# Patient Record
Sex: Male | Born: 1965 | Race: White | Hispanic: No | Marital: Married | Smoking: Former smoker
Health system: Southern US, Community
[De-identification: ages and names within clinical notes are randomized; demographics above are authoritative.]

## PROBLEM LIST (undated history)

## (undated) DIAGNOSIS — I10 Essential (primary) hypertension: Secondary | ICD-10-CM

## (undated) DIAGNOSIS — I2510 Atherosclerotic heart disease of native coronary artery without angina pectoris: Secondary | ICD-10-CM

## (undated) DIAGNOSIS — E785 Hyperlipidemia, unspecified: Secondary | ICD-10-CM

## (undated) DIAGNOSIS — I200 Unstable angina: Secondary | ICD-10-CM

## (undated) HISTORY — DX: Atherosclerotic heart disease of native coronary artery without angina pectoris: I25.10

---
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
## 2004-09-23 ENCOUNTER — Ambulatory Visit: Payer: Self-pay | Admitting: Family Medicine

## 2004-10-10 ENCOUNTER — Ambulatory Visit: Payer: Self-pay | Admitting: Family Medicine

## 2004-11-02 ENCOUNTER — Ambulatory Visit: Payer: Self-pay | Admitting: Cardiovascular Disease

## 2004-11-08 ENCOUNTER — Ambulatory Visit: Payer: Self-pay

## 2005-01-05 ENCOUNTER — Ambulatory Visit: Payer: Self-pay | Admitting: Family Medicine

## 2005-02-17 ENCOUNTER — Ambulatory Visit: Payer: Self-pay | Admitting: Family Medicine

## 2005-03-06 ENCOUNTER — Ambulatory Visit: Payer: Self-pay | Admitting: Family Medicine

## 2005-03-21 ENCOUNTER — Ambulatory Visit: Payer: Self-pay | Admitting: Family Medicine

## 2005-04-25 ENCOUNTER — Ambulatory Visit: Payer: Self-pay | Admitting: Family Medicine

## 2005-05-23 ENCOUNTER — Ambulatory Visit: Payer: Self-pay | Admitting: Family Medicine

## 2005-07-19 ENCOUNTER — Ambulatory Visit: Payer: Self-pay | Admitting: Family Medicine

## 2005-10-02 ENCOUNTER — Ambulatory Visit: Payer: Self-pay | Admitting: Family Medicine

## 2005-11-23 ENCOUNTER — Ambulatory Visit: Payer: Self-pay | Admitting: Cardiology

## 2005-11-29 ENCOUNTER — Ambulatory Visit: Payer: Self-pay | Admitting: Cardiology

## 2005-12-14 ENCOUNTER — Ambulatory Visit: Payer: Self-pay | Admitting: Cardiology

## 2018-07-22 DIAGNOSIS — I219 Acute myocardial infarction, unspecified: Secondary | ICD-10-CM

## 2018-07-22 HISTORY — DX: Acute myocardial infarction, unspecified: I21.9

## 2018-07-23 ENCOUNTER — Emergency Department (HOSPITAL_COMMUNITY): Payer: PRIVATE HEALTH INSURANCE

## 2018-07-23 ENCOUNTER — Emergency Department (HOSPITAL_COMMUNITY)
Admission: EM | Admit: 2018-07-23 | Discharge: 2018-07-23 | Disposition: A | Discharge status: Home / Self Care | Payer: PRIVATE HEALTH INSURANCE | Attending: Emergency Medicine | Admitting: Emergency Medicine

## 2018-07-23 ENCOUNTER — Other Ambulatory Visit: Payer: Self-pay

## 2018-07-23 ENCOUNTER — Encounter (HOSPITAL_COMMUNITY): Payer: Self-pay | Admitting: *Deleted

## 2018-07-23 DIAGNOSIS — R079 Chest pain, unspecified: Secondary | ICD-10-CM | POA: Diagnosis present

## 2018-07-23 DIAGNOSIS — E876 Hypokalemia: Secondary | ICD-10-CM | POA: Insufficient documentation

## 2018-07-23 DIAGNOSIS — I10 Essential (primary) hypertension: Secondary | ICD-10-CM | POA: Diagnosis not present

## 2018-07-23 DIAGNOSIS — Z87891 Personal history of nicotine dependence: Secondary | ICD-10-CM | POA: Diagnosis not present

## 2018-07-23 HISTORY — DX: Essential (primary) hypertension: I10

## 2018-07-23 LAB — CBC WITH DIFFERENTIAL/PLATELET
Abs Immature Granulocytes: 0.02 10*3/uL (ref 0.00–0.07)
Basophils Absolute: 0.1 10*3/uL (ref 0.0–0.1)
Basophils Relative: 1 %
Eosinophils Absolute: 0.2 10*3/uL (ref 0.0–0.5)
Eosinophils Relative: 3 %
HCT: 46.7 % (ref 39.0–52.0)
Hemoglobin: 15.9 g/dL (ref 13.0–17.0)
Immature Granulocytes: 0 %
Lymphocytes Relative: 25 %
Lymphs Abs: 1.5 10*3/uL (ref 0.7–4.0)
MCH: 30.4 pg (ref 26.0–34.0)
MCHC: 34 g/dL (ref 30.0–36.0)
MCV: 89.3 fL (ref 80.0–100.0)
Monocytes Absolute: 0.6 10*3/uL (ref 0.1–1.0)
Monocytes Relative: 10 %
Neutro Abs: 3.6 10*3/uL (ref 1.7–7.7)
Neutrophils Relative %: 61 %
Platelets: 256 10*3/uL (ref 150–400)
RBC: 5.23 MIL/uL (ref 4.22–5.81)
RDW: 13.2 % (ref 11.5–15.5)
WBC: 6 10*3/uL (ref 4.0–10.5)
nRBC: 0 % (ref 0.0–0.2)

## 2018-07-23 LAB — TROPONIN I: Troponin I: <0.03 ng/mL (ref <0.03)

## 2018-07-23 LAB — BASIC METABOLIC PANEL
Anion gap: 13 (ref 5–15)
BUN: 11 mg/dL (ref 6–20)
CO2: 23 mmol/L (ref 22–32)
Calcium: 9.2 mg/dL (ref 8.9–10.3)
Chloride: 103 mmol/L (ref 98–111)
Creatinine, Ser: 0.8 mg/dL (ref 0.61–1.24)
GFR calc Af Amer: >60 mL/min (ref >60)
GFR calc non Af Amer: >60 mL/min (ref >60)
Glucose, Bld: 133 mg/dL — ABNORMAL HIGH (ref 70–99)
Potassium: 3.3 mmol/L — ABNORMAL LOW (ref 3.5–5.1)
Sodium: 139 mmol/L (ref 135–145)

## 2018-07-23 MED ORDER — ASPIRIN EC 325 MG PO TBEC
325.0000 mg | DELAYED_RELEASE_TABLET | Freq: Once | ORAL | Status: AC
  Administered 2018-07-23: 09:00:00 325 mg via ORAL
  Filled 2018-07-23: qty 1

## 2018-07-23 NOTE — ED Triage Notes (Signed)
Pt c/o intermittent mid chest pain that radiates to the left elbow and back of neck along with SOB x few weeks. Denies n/v, dizziness.

## 2018-07-23 NOTE — Discharge Instructions (Addendum)
The testing today did not show any serious problems.  Your discomfort may be related to a cardiac problem.  If the discomfort worsens or has other associated symptoms that are concerning, you should return here immediately for further evaluation.  Call your doctor today and let him know that you had to come over here for a checkup.  Proceed with the testing, and follow-up evaluation by cardiology, as planned.  Begin taking one coated aspirin, every day, tomorrow morning.  For now you need to avoid exertion, and stress.  If you are drinking alcohol try to minimize that.  We are giving you a note to avoid working, until you follow-up with the heart doctor.  Your potassium was slightly low today at 3.3.  Try to eat some foods containing potassium, to build that up.  See the attached list.  It could be related to your blood pressure medicine.  Ask your doctor to look into that.

## 2018-07-23 NOTE — ED Provider Notes (Signed)
[facility] EMERGENCY DEPARTMENT Provider Note   CSN: [id number] Arrival date & time: 07/23/18  1700    History   Chief Complaint Chief Complaint  Patient presents with  . Chest Pain    HPI Jordan Gross is a 53 y.o. male.     HPI   He presents for evaluation of chest discomfort.  The discomfort is intermittent and caused only by exertion such as climbing a ladder to work on a roof.  He works as a Designer, fashion/clothing.  Onset of the discomfort, 2 weeks ago, happening more frequently in the last several days.  He is vague about the description, to nursing described as "burning", to me described it as "I get short of breath."  The discomfort is always intermittent and improves with rest within 2 minutes.  He has had to modify his activities to avoid the discomfort.  No prior similar problems.  He takes an antihypertensive.  He saw his PCP yesterday had some blood work and an EKG done but does not know the results, yet.  Apparently, the PCP ordered an echocardiogram and set up a follow-up appointment with a cardiologist, he is waiting to hear the exact timing of those appointments.  He had an episode of the discomfort while walking into a building today, getting ready to do a job.  Currently in the ED he does not have the discomfort.  He denies nausea, vomiting, fever, chills, abdominal or back pain.  He is not having dizziness or numbness.  There are no other known modifying factors.    Past Medical History:  Diagnosis Date  . Hypertension     There are no active problems to display for this patient.   History reviewed. No pertinent surgical history.      Home Medications    Prior to Admission medications   Not on File    Family History Family History  Problem Relation Age of Onset  . Heart attack Mother 59    Social History Social History   Tobacco Use  . Smoking status: Former Games developer  . Smokeless tobacco: Never Used  Substance Use Topics  . Alcohol use: Yes   Alcohol/week: 70.0 standard drinks    Types: 70 Cans of beer per week    Comment: 10 beers daily in the evening  . Drug use: Never     Allergies   Other   Review of Systems Review of Systems  All other systems reviewed and are negative.    Physical Exam Updated Vital Signs BP 135/75   Pulse 74   Temp 98.1 F (36.7 C) (Oral) Comment: Simultaneous filing. User may not have seen previous data. Comment (Src): Simultaneous filing. User may not have seen previous data.  Resp 12   Ht 5\' 11"  (1.803 m)   Wt 117.9 kg   SpO2 96%   BMI 36.26 kg/m   Physical Exam Vitals signs and nursing note reviewed.  Constitutional:      General: He is not in acute distress.    Appearance: He is well-developed. He is obese. He is not ill-appearing, toxic-appearing or diaphoretic.  HENT:     Head: Normocephalic and atraumatic.     Right Ear: External ear normal.     Left Ear: External ear normal.  Eyes:     Conjunctiva/sclera: Conjunctivae normal.     Pupils: Pupils are equal, round, and reactive to light.  Neck:     Musculoskeletal: Normal range of motion and neck supple.  Trachea: Phonation normal.  Cardiovascular:     Rate and Rhythm: Normal rate and regular rhythm.     Heart sounds: Normal heart sounds.  Pulmonary:     Effort: Pulmonary effort is normal. No respiratory distress.     Breath sounds: Normal breath sounds. No stridor. No rhonchi.  Abdominal:     General: There is no distension.     Palpations: Abdomen is soft.     Tenderness: There is no abdominal tenderness.  Musculoskeletal: Normal range of motion.        General: No swelling or tenderness.     Right lower leg: No edema.     Left lower leg: No edema.  Skin:    General: Skin is warm and dry.  Neurological:     Mental Status: He is alert and oriented to person, place, and time.     Cranial Nerves: No cranial nerve deficit.     Sensory: No sensory deficit.     Motor: No abnormal muscle tone.     Coordination:  Coordination normal.  Psychiatric:        Mood and Affect: Mood normal.        Behavior: Behavior normal.        Thought Content: Thought content normal.        Judgment: Judgment normal.      ED Treatments / Results  Labs (all labs ordered are listed, but only abnormal results are displayed) Labs Reviewed  BASIC METABOLIC PANEL - Abnormal; Notable for the following components:      Result Value   Potassium 3.3 (*)    Glucose, Bld 133 (*)    All other components within normal limits  TROPONIN I  CBC WITH DIFFERENTIAL/PLATELET    EKG EKG Interpretation  Date/Time:  Tuesday July 23 2018 07:35:33 EDT Ventricular Rate:  70 PR Interval:    QRS Duration: 95 QT Interval:  430 QTC Calculation: 464 R Axis:   83 Text Interpretation:  Sinus rhythm Anteroseptal infarct, age indeterminate No old tracing to compare Confirmed by Mancel Bale [phone]) on 07/23/2018 7:52:53 AM   Radiology Dg Chest Port 1 View  Result Date: 07/23/2018 CLINICAL DATA:  Intermittent chest pain. EXAM: PORTABLE CHEST 1 VIEW COMPARISON:  No prior. FINDINGS: Mediastinum and hilar structures normal. Cardiomegaly with normal pulmonary vascularity. Low lung volumes. Mild peribronchial cuffing noted. Bronchitis cannot be excluded. No focal infiltrate. No pleural effusion or pneumothorax. No acute bony abnormality. IMPRESSION: Low lung volumes. Mild peribronchial cuffing. Bronchitis cannot be excluded. Electronically Signed   By: Maisie Fus  Register   On: 07/23/2018 08:10    Procedures Procedures (including critical care time)  Medications Ordered in ED Medications  aspirin EC tablet 325 mg (325 mg Oral Given 07/23/18 0835)     Initial Impression / Assessment and Plan / ED Course  I have reviewed the triage vital signs and the nursing notes.  Pertinent labs & imaging results that were available during my care of the patient were reviewed by me and considered in my medical decision making (see chart for details).   Clinical Course as of Jul 22 848  Tue Jul 23, 2018  0817 Normal except potassium low, glucose high  Basic metabolic panel(!) [EW]  [id number] Normal  Troponin I - Once [EW]  0817 Normal  CBC with Differential [EW]  0818 No infiltrate or CHF, image reviewed by me  [facility] [EW]    Clinical Course User Index [EW] Mancel Bale, MD  Patient Vitals for the past 24 hrs:  BP Temp Temp src Pulse Resp SpO2 Height Weight  07/23/18 0835 135/75 - - 74 12 96 % - -  07/23/18 0800 (!) 148/76 - - 72 19 96 % - -  07/23/18 0736 (!) 153/88 98.1 F (36.7 C) Oral 76 18 97 % - -  07/23/18 0730 - - - - - - 5\' 11"  (1.803 m) 117.9 kg    8:43 AM Reevaluation with update and discussion. After initial assessment and treatment, an updated evaluation reveals he continues to be comfortable and symptom-free.  Findings discussed with the patient and all questions were answered. Mancel BaleElliott Hanif Radin   Medical Decision Making: Nonspecific chest discomfort, etiology includes cardiac causes.  Patient is non-smoker.  Chest x-ray somewhat abnormal but not diagnostic for an acute pulmonary process.  I have relatively low suspicion for ACS, in this patient who has a low cardiac risk profile.  His symptoms are subacute, and nonexistent in the ED.  He is stable for the outpatient evaluation which is already scheduled, by his PCP.  He will be advised to not work until he follows up with the cardiologist.  Also given advice on return for worsening symptoms.  CRITICAL CARE-no Performed by: Mancel BaleElliott Jaysa Kise   Nursing Notes Reviewed/ Care Coordinated Applicable Imaging Reviewed Interpretation of Laboratory Data incorporated into ED treatment  The patient appears reasonably screened and/or stabilized for discharge and I doubt any other medical condition or other [facility] requiring further screening, evaluation, or treatment in the ED at this time prior to discharge.  Plan: Home Medications-continue current medication and  take a coated aspirin daily.; Home Treatments-avoid exertion; return here if the recommended treatment, does not improve the symptoms; Recommended follow up-follow-up with PCP and cardiology as scheduled.  Return here for exacerbation of symptoms.    Final Clinical Impressions(s) / ED Diagnoses   Final diagnoses:  Nonspecific chest pain  Hypokalemia    ED Discharge Orders    None       Mancel BaleWentz, Corvin Sorbo, MD 07/23/18 [phone]

## 2018-07-30 ENCOUNTER — Telehealth: Payer: Self-pay | Admitting: Cardiology

## 2018-07-30 NOTE — Telephone Encounter (Signed)

## 2018-07-31 ENCOUNTER — Encounter: Payer: Self-pay | Admitting: Cardiology

## 2018-07-31 ENCOUNTER — Encounter: Payer: Self-pay | Admitting: *Deleted

## 2018-07-31 ENCOUNTER — Telehealth: Payer: Self-pay | Admitting: Cardiology

## 2018-07-31 ENCOUNTER — Ambulatory Visit (INDEPENDENT_AMBULATORY_CARE_PROVIDER_SITE_OTHER): Payer: No Typology Code available for payment source | Admitting: Cardiology

## 2018-07-31 VITALS — BP 145/98 | HR 70 | Temp 98.7°F | Ht 72.0 in | Wt 260.2 lb

## 2018-07-31 DIAGNOSIS — R0789 Other chest pain: Secondary | ICD-10-CM | POA: Diagnosis not present

## 2018-07-31 DIAGNOSIS — R079 Chest pain, unspecified: Secondary | ICD-10-CM

## 2018-07-31 NOTE — Telephone Encounter (Signed)
Pre-cert Verification for the following procedure    EXERCISE NUCLEAR STRESS TEST scheduled for 08-02-2018 at [facility]

## 2018-07-31 NOTE — Progress Notes (Signed)
     Clinical Summary Jordan Gross is a 53 y.o.male seen as new patient for the following medical problems.    1. Chest pain - ER visit 07/23/2018 with chest pain - EKG SR, nonspecific ST/T changes - CXR mild peribronchial cuffing - trop neg x 1   - chest pain started 2-3 weeks ago - burning/pressure, can feel in neck, midchest, headache, into left. Choking like feeling. Tends to occur with activity. No SOB. Sits down and rest, 2 minutes resolves. Not positional  CAD risk factors: HTN, borderlin cholesterol, former smoke x 20 years, mother MI age 57, reports sister with recent stents age 68.   Past Medical History:  Diagnosis Date  . Hypertension      Allergies  Allergen Reactions  . Other     Some type of cholesterol medication, unknown what type that caused muscle cramps in his legs     No current outpatient medications on file.   No current facility-administered medications for this visit.      No past surgical history on file.   Allergies  Allergen Reactions  . Other     Some type of cholesterol medication, unknown what type that caused muscle cramps in his legs      Family History  Problem Relation Age of Onset  . Heart attack Mother 35     Social History Jordan Gross reports that he has quit smoking. He has never used smokeless tobacco. Jordan Gross reports current alcohol use of about 70.0 standard drinks of alcohol per week.   Review of Systems CONSTITUTIONAL: No weight loss, fever, chills, weakness or fatigue.  HEENT: Eyes: No visual loss, blurred vision, double vision or yellow sclerae.No hearing loss, sneezing, congestion, runny nose or sore throat.  SKIN: No rash or itching.  CARDIOVASCULAR: per hpi RESPIRATORY: No shortness of breath, cough or sputum.  GASTROINTESTINAL: No anorexia, nausea, vomiting or diarrhea. No abdominal pain or blood.  GENITOURINARY: No burning on urination, no polyuria NEUROLOGICAL: No headache, dizziness, syncope,  paralysis, ataxia, numbness or tingling in the extremities. No change in bowel or bladder control.  MUSCULOSKELETAL: No muscle, back pain, joint pain or stiffness.  LYMPHATICS: No enlarged nodes. No history of splenectomy.  PSYCHIATRIC: No history of depression or anxiety.  ENDOCRINOLOGIC: No reports of sweating, cold or heat intolerance. No polyuria or polydipsia.  Marland Kitchen   Physical Examination Vitals:   07/31/18 1439 07/31/18 1444  BP: (!) 144/95 (!) 145/98  Pulse: 71 70  Temp: 98.7 F (37.1 C)   SpO2: 97% 97%    Gen: resting comfortably, no acute distress HEENT: no scleral icterus, pupils equal round and reactive, no palptable cervical adenopathy,  CV: RRR, no mr/g no jvd Resp: Clear to auscultation bilaterally GI: abdomen is soft, non-tender, non-distended, normal bowel sounds, no hepatosplenomegaly MSK: extremities are warm, no edema.  Skin: warm, no rash Neuro:  no focal deficits Psych: appropriate affect      Assessment and Plan  1. Chest pain - mixed symptoms, though they do seem to be exertional - multiple CAD risk factors - we will plan for an exercise nuclear stress test  Work excuse given until Tues June 16.       Arnoldo Lenis, M.D.

## 2018-07-31 NOTE — Patient Instructions (Signed)
Your physician recommends that you schedule a follow-up appointment in: 1 MONTH WITH DR BRANCH  Your physician recommends that you continue on your current medications as directed. Please refer to the Current Medication list given to you today.  Your physician has requested that you have en exercise stress myoview. For further information please visit [email or web address]. Please follow instruction sheet, as given.  Thank you for choosing [facility]!!    

## 2018-08-02 ENCOUNTER — Inpatient Hospital Stay (HOSPITAL_COMMUNITY): Payer: PRIVATE HEALTH INSURANCE

## 2018-08-02 ENCOUNTER — Encounter (HOSPITAL_COMMUNITY): Payer: Self-pay

## 2018-08-02 ENCOUNTER — Other Ambulatory Visit: Payer: Self-pay

## 2018-08-02 ENCOUNTER — Emergency Department (HOSPITAL_COMMUNITY): Payer: PRIVATE HEALTH INSURANCE

## 2018-08-02 ENCOUNTER — Encounter (HOSPITAL_COMMUNITY)
Admission: RE | Admit: 2018-08-02 | Discharge: 2018-08-02 | Disposition: A | Discharge status: Home / Self Care | Payer: PRIVATE HEALTH INSURANCE | Source: Ambulatory Visit | Attending: Cardiology | Admitting: Cardiology

## 2018-08-02 ENCOUNTER — Inpatient Hospital Stay (HOSPITAL_COMMUNITY)
Admission: EM | Admit: 2018-08-02 | Discharge: 2018-08-03 | DRG: 247 | Disposition: A | Discharge status: Home / Self Care | Payer: PRIVATE HEALTH INSURANCE | Attending: Interventional Cardiology | Admitting: Interventional Cardiology

## 2018-08-02 ENCOUNTER — Inpatient Hospital Stay (HOSPITAL_COMMUNITY)
Admission: EM | Disposition: A | Discharge status: Home / Self Care | Payer: Self-pay | Source: Home / Self Care | Attending: Interventional Cardiology

## 2018-08-02 DIAGNOSIS — I213 ST elevation (STEMI) myocardial infarction of unspecified site: Principal | ICD-10-CM | POA: Diagnosis present

## 2018-08-02 DIAGNOSIS — R079 Chest pain, unspecified: Secondary | ICD-10-CM | POA: Diagnosis present

## 2018-08-02 DIAGNOSIS — Z95820 Peripheral vascular angioplasty status with implants and grafts: Secondary | ICD-10-CM

## 2018-08-02 DIAGNOSIS — I249 Acute ischemic heart disease, unspecified: Secondary | ICD-10-CM | POA: Diagnosis not present

## 2018-08-02 DIAGNOSIS — I25110 Atherosclerotic heart disease of native coronary artery with unstable angina pectoris: Secondary | ICD-10-CM

## 2018-08-02 DIAGNOSIS — I200 Unstable angina: Secondary | ICD-10-CM | POA: Diagnosis not present

## 2018-08-02 DIAGNOSIS — Z955 Presence of coronary angioplasty implant and graft: Secondary | ICD-10-CM

## 2018-08-02 DIAGNOSIS — Z1159 Encounter for screening for other viral diseases: Secondary | ICD-10-CM

## 2018-08-02 DIAGNOSIS — Z79899 Other long term (current) drug therapy: Secondary | ICD-10-CM | POA: Diagnosis not present

## 2018-08-02 DIAGNOSIS — Z8249 Family history of ischemic heart disease and other diseases of the circulatory system: Secondary | ICD-10-CM | POA: Diagnosis not present

## 2018-08-02 DIAGNOSIS — R9439 Abnormal result of other cardiovascular function study: Secondary | ICD-10-CM

## 2018-08-02 DIAGNOSIS — E785 Hyperlipidemia, unspecified: Secondary | ICD-10-CM | POA: Diagnosis present

## 2018-08-02 DIAGNOSIS — R0789 Other chest pain: Secondary | ICD-10-CM | POA: Insufficient documentation

## 2018-08-02 DIAGNOSIS — I2102 ST elevation (STEMI) myocardial infarction involving left anterior descending coronary artery: Secondary | ICD-10-CM | POA: Diagnosis present

## 2018-08-02 DIAGNOSIS — I10 Essential (primary) hypertension: Secondary | ICD-10-CM | POA: Diagnosis present

## 2018-08-02 DIAGNOSIS — Z7982 Long term (current) use of aspirin: Secondary | ICD-10-CM

## 2018-08-02 DIAGNOSIS — Z888 Allergy status to other drugs, medicaments and biological substances status: Secondary | ICD-10-CM | POA: Diagnosis not present

## 2018-08-02 HISTORY — PX: LEFT HEART CATH AND CORONARY ANGIOGRAPHY: CATH118249

## 2018-08-02 HISTORY — DX: Unstable angina: I20.0

## 2018-08-02 HISTORY — PX: CORONARY/GRAFT ACUTE MI REVASCULARIZATION: CATH118305

## 2018-08-02 LAB — PROTIME-INR
INR: 1 (ref 0.8–1.2)
Prothrombin Time: 12.8 seconds (ref 11.4–15.2)

## 2018-08-02 LAB — CBC WITH DIFFERENTIAL/PLATELET
Abs Immature Granulocytes: 0.06 10*3/uL (ref 0.00–0.07)
Basophils Absolute: 0.1 10*3/uL (ref 0.0–0.1)
Basophils Relative: 1 %
Eosinophils Absolute: 0.1 10*3/uL (ref 0.0–0.5)
Eosinophils Relative: 1 %
HCT: 47.4 % (ref 39.0–52.0)
Hemoglobin: 16.3 g/dL (ref 13.0–17.0)
Immature Granulocytes: 1 %
Lymphocytes Relative: 16 %
Lymphs Abs: 1.5 10*3/uL (ref 0.7–4.0)
MCH: 30.5 pg (ref 26.0–34.0)
MCHC: 34.4 g/dL (ref 30.0–36.0)
MCV: 88.6 fL (ref 80.0–100.0)
Monocytes Absolute: 0.8 10*3/uL (ref 0.1–1.0)
Monocytes Relative: 9 %
Neutro Abs: 6.5 10*3/uL (ref 1.7–7.7)
Neutrophils Relative %: 72 %
Platelets: 313 10*3/uL (ref 150–400)
RBC: 5.35 MIL/uL (ref 4.22–5.81)
RDW: 12.8 % (ref 11.5–15.5)
WBC: 8.9 10*3/uL (ref 4.0–10.5)
nRBC: 0 % (ref 0.0–0.2)

## 2018-08-02 LAB — ECHOCARDIOGRAM COMPLETE
Height: 74 in
Weight: 4160 oz

## 2018-08-02 LAB — NM MYOCAR SINGLE W/SPECT
Peak HR: 122 {beats}/min
Rest HR: 94 {beats}/min

## 2018-08-02 LAB — BASIC METABOLIC PANEL
Anion gap: 12 (ref 5–15)
BUN: 14 mg/dL (ref 6–20)
CO2: 23 mmol/L (ref 22–32)
Calcium: 9.3 mg/dL (ref 8.9–10.3)
Chloride: 103 mmol/L (ref 98–111)
Creatinine, Ser: 0.82 mg/dL (ref 0.61–1.24)
GFR calc Af Amer: >60 mL/min (ref >60)
GFR calc non Af Amer: >60 mL/min (ref >60)
Glucose, Bld: 135 mg/dL — ABNORMAL HIGH (ref 70–99)
Potassium: 3.5 mmol/L (ref 3.5–5.1)
Sodium: 138 mmol/L (ref 135–145)

## 2018-08-02 LAB — POCT ACTIVATED CLOTTING TIME
Activated Clotting Time: 290 seconds
Activated Clotting Time: 274 seconds

## 2018-08-02 LAB — LIPID PANEL
Cholesterol: 206 mg/dL — ABNORMAL HIGH (ref 0–200)
HDL: 31 mg/dL — ABNORMAL LOW (ref >40)
LDL Cholesterol: 124 mg/dL — ABNORMAL HIGH (ref 0–99)
Total CHOL/HDL Ratio: 6.6 RATIO
Triglycerides: 256 mg/dL — ABNORMAL HIGH (ref <150)
VLDL: 51 mg/dL — ABNORMAL HIGH (ref 0–40)

## 2018-08-02 LAB — TROPONIN I
Troponin I: 0.07 ng/mL (ref <0.03)
Troponin I: 0.37 ng/mL (ref <0.03)
Troponin I: 1.24 ng/mL (ref <0.03)

## 2018-08-02 LAB — SARS CORONAVIRUS 2 BY RT PCR (HOSPITAL ORDER, PERFORMED IN ~~LOC~~ HOSPITAL LAB): SARS Coronavirus 2: NEGATIVE

## 2018-08-02 LAB — HEMOGLOBIN A1C
Hgb A1c MFr Bld: 5.9 % — ABNORMAL HIGH (ref 4.8–5.6)
Mean Plasma Glucose: 122.63 mg/dL

## 2018-08-02 LAB — APTT: aPTT: 26 seconds (ref 24–36)

## 2018-08-02 LAB — TSH: TSH: 2.664 u[IU]/mL (ref 0.350–4.500)

## 2018-08-02 SURGERY — CORONARY/GRAFT ACUTE MI REVASCULARIZATION
Anesthesia: LOCAL

## 2018-08-02 MED ORDER — NITROGLYCERIN 0.4 MG SL SUBL
SUBLINGUAL_TABLET | SUBLINGUAL | Status: AC
  Administered 2018-08-02: 10:00:00 via SUBLINGUAL
  Filled 2018-08-02: qty 1

## 2018-08-02 MED ORDER — HEPARIN (PORCINE) IN NACL 1000-0.9 UT/500ML-% IV SOLN
INTRAVENOUS | Status: AC
  Filled 2018-08-02: qty 1000

## 2018-08-02 MED ORDER — HYDRALAZINE HCL 20 MG/ML IJ SOLN
10.0000 mg | Freq: Four times a day (QID) | INTRAMUSCULAR | Status: DC | PRN
  Administered 2018-08-03: 10 mg via INTRAVENOUS
  Filled 2018-08-02: qty 1

## 2018-08-02 MED ORDER — NITROGLYCERIN 1 MG/10 ML FOR IR/CATH LAB
INTRA_ARTERIAL | Status: DC | PRN
  Administered 2018-08-02: 200 ug via INTRACORONARY
  Administered 2018-08-02: 400 ug via INTRA_ARTERIAL

## 2018-08-02 MED ORDER — FENTANYL CITRATE (PF) 100 MCG/2ML IJ SOLN
INTRAMUSCULAR | Status: AC
  Filled 2018-08-02: qty 2

## 2018-08-02 MED ORDER — TICAGRELOR 90 MG PO TABS
ORAL_TABLET | ORAL | Status: DC | PRN
  Administered 2018-08-02: 180 mg via ORAL

## 2018-08-02 MED ORDER — SODIUM CHLORIDE 0.9% FLUSH
3.0000 mL | Freq: Two times a day (BID) | INTRAVENOUS | Status: DC
  Administered 2018-08-02: 3 mL via INTRAVENOUS

## 2018-08-02 MED ORDER — TICAGRELOR 90 MG PO TABS
90.0000 mg | ORAL_TABLET | Freq: Two times a day (BID) | ORAL | Status: DC
  Administered 2018-08-02 – 2018-08-03 (×2): 90 mg via ORAL
  Filled 2018-08-02 (×2): qty 1

## 2018-08-02 MED ORDER — HYDRALAZINE HCL 20 MG/ML IJ SOLN: 10.0000 mg | INTRAMUSCULAR | Status: DC | PRN

## 2018-08-02 MED ORDER — TIROFIBAN (AGGRASTAT) BOLUS VIA INFUSION
INTRAVENOUS | Status: DC | PRN
  Administered 2018-08-02: 12:00:00 2947.5 ug via INTRAVENOUS

## 2018-08-02 MED ORDER — ONDANSETRON HCL 4 MG/2ML IJ SOLN: 4.0000 mg | Freq: Four times a day (QID) | INTRAMUSCULAR | Status: DC | PRN

## 2018-08-02 MED ORDER — LOSARTAN POTASSIUM 50 MG PO TABS
100.0000 mg | ORAL_TABLET | Freq: Every day | ORAL | Status: DC
  Administered 2018-08-03: 100 mg via ORAL
  Filled 2018-08-02: qty 2

## 2018-08-02 MED ORDER — NITROGLYCERIN IN D5W 200-5 MCG/ML-% IV SOLN
5.0000 ug/min | INTRAVENOUS | Status: DC
  Filled 2018-08-02: qty 250

## 2018-08-02 MED ORDER — THE SENSUOUS HEART BOOK
Freq: Once | Status: AC
  Administered 2018-08-03: 05:00:00
  Filled 2018-08-02: qty 1

## 2018-08-02 MED ORDER — METOPROLOL TARTRATE 12.5 MG HALF TABLET
12.5000 mg | ORAL_TABLET | Freq: Two times a day (BID) | ORAL | Status: DC
  Administered 2018-08-02: 19:00:00 12.5 mg via ORAL
  Filled 2018-08-02: qty 1

## 2018-08-02 MED ORDER — HEPARIN SODIUM (PORCINE) 1000 UNIT/ML IJ SOLN
INTRAMUSCULAR | Status: DC | PRN
  Administered 2018-08-02: 6000 [IU] via INTRAVENOUS
  Administered 2018-08-02: 5000 [IU] via INTRAVENOUS
  Administered 2018-08-02: 2000 [IU] via INTRAVENOUS

## 2018-08-02 MED ORDER — TIROFIBAN HCL IN NACL 5-0.9 MG/100ML-% IV SOLN
INTRAVENOUS | Status: AC
  Filled 2018-08-02: qty 100

## 2018-08-02 MED ORDER — HEART ATTACK BOUNCING BOOK
Freq: Once | Status: AC
  Administered 2018-08-03: 05:00:00
  Filled 2018-08-02: qty 1

## 2018-08-02 MED ORDER — MIDAZOLAM HCL 2 MG/2ML IJ SOLN
INTRAMUSCULAR | Status: DC | PRN
  Administered 2018-08-02: 2 mg via INTRAVENOUS

## 2018-08-02 MED ORDER — ASPIRIN 325 MG PO TABS
325.0000 mg | ORAL_TABLET | Freq: Once | ORAL | Status: AC
  Administered 2018-08-02: 325 mg via ORAL
  Filled 2018-08-02: qty 1

## 2018-08-02 MED ORDER — VERAPAMIL HCL 2.5 MG/ML IV SOLN
INTRAVENOUS | Status: DC | PRN
  Administered 2018-08-02: 12:00:00 10 mL via INTRA_ARTERIAL

## 2018-08-02 MED ORDER — ZOLPIDEM TARTRATE 5 MG PO TABS: 5.0000 mg | ORAL_TABLET | Freq: Every evening | ORAL | Status: DC | PRN

## 2018-08-02 MED ORDER — FENTANYL CITRATE (PF) 100 MCG/2ML IJ SOLN
INTRAMUSCULAR | Status: DC | PRN
  Administered 2018-08-02: 50 ug via INTRAVENOUS

## 2018-08-02 MED ORDER — NITROGLYCERIN 0.4 MG SL SUBL: 0.4000 mg | SUBLINGUAL_TABLET | SUBLINGUAL | Status: DC | PRN

## 2018-08-02 MED ORDER — HEPARIN (PORCINE) 25000 UT/250ML-% IV SOLN: 1400.0000 [IU]/h | INTRAVENOUS | Status: DC

## 2018-08-02 MED ORDER — LIDOCAINE HCL (PF) 1 % IJ SOLN
INTRAMUSCULAR | Status: DC | PRN
  Administered 2018-08-02: 2 mL

## 2018-08-02 MED ORDER — LIDOCAINE HCL (PF) 1 % IJ SOLN
INTRAMUSCULAR | Status: AC
  Filled 2018-08-02: qty 30

## 2018-08-02 MED ORDER — HEPARIN SODIUM (PORCINE) 5000 UNIT/ML IJ SOLN
4000.0000 [IU] | Freq: Once | INTRAMUSCULAR | Status: AC
  Administered 2018-08-02: 4000 [IU] via INTRAVENOUS
  Filled 2018-08-02: qty 1

## 2018-08-02 MED ORDER — VERAPAMIL HCL 2.5 MG/ML IV SOLN
INTRAVENOUS | Status: AC
  Filled 2018-08-02: qty 2

## 2018-08-02 MED ORDER — HEPARIN (PORCINE) IN NACL 1000-0.9 UT/500ML-% IV SOLN
INTRAVENOUS | Status: DC | PRN
  Administered 2018-08-02 (×2): 500 mL

## 2018-08-02 MED ORDER — NITROGLYCERIN IN D5W 200-5 MCG/ML-% IV SOLN: 5.0000 ug/min | INTRAVENOUS | Status: DC

## 2018-08-02 MED ORDER — SODIUM CHLORIDE 0.9 % IV SOLN
INTRAVENOUS | Status: AC | PRN
  Administered 2018-08-02: 10 mL/h via INTRAVENOUS

## 2018-08-02 MED ORDER — ROSUVASTATIN CALCIUM 20 MG PO TABS
20.0000 mg | ORAL_TABLET | Freq: Every day | ORAL | Status: DC
  Administered 2018-08-02: 20 mg via ORAL
  Filled 2018-08-02: qty 1

## 2018-08-02 MED ORDER — SODIUM CHLORIDE 0.9% FLUSH
INTRAVENOUS | Status: AC
  Administered 2018-08-02: 10 mL via INTRAVENOUS
  Filled 2018-08-02: qty 10

## 2018-08-02 MED ORDER — SODIUM CHLORIDE 0.9 % IV SOLN: 250.0000 mL | INTRAVENOUS | Status: DC | PRN

## 2018-08-02 MED ORDER — IOHEXOL 350 MG/ML SOLN
INTRAVENOUS | Status: DC | PRN
  Administered 2018-08-02: 165 mL via INTRA_ARTERIAL

## 2018-08-02 MED ORDER — ACETAMINOPHEN 325 MG PO TABS: 650.0000 mg | ORAL_TABLET | ORAL | Status: DC | PRN

## 2018-08-02 MED ORDER — NITROGLYCERIN 1 MG/10 ML FOR IR/CATH LAB
INTRA_ARTERIAL | Status: AC
  Filled 2018-08-02: qty 10

## 2018-08-02 MED ORDER — AMLODIPINE BESYLATE 10 MG PO TABS
10.0000 mg | ORAL_TABLET | Freq: Every day | ORAL | Status: DC
  Administered 2018-08-03: 10 mg via ORAL
  Filled 2018-08-02: qty 1

## 2018-08-02 MED ORDER — ANGIOPLASTY BOOK
Freq: Once | Status: AC
  Administered 2018-08-03: 05:00:00
  Filled 2018-08-02: qty 1

## 2018-08-02 MED ORDER — SODIUM CHLORIDE 0.9% FLUSH: 3.0000 mL | INTRAVENOUS | Status: DC | PRN

## 2018-08-02 MED ORDER — ATORVASTATIN CALCIUM 80 MG PO TABS: 80.0000 mg | ORAL_TABLET | Freq: Every day | ORAL | Status: DC

## 2018-08-02 MED ORDER — TICAGRELOR 90 MG PO TABS
ORAL_TABLET | ORAL | Status: AC
  Filled 2018-08-02: qty 2

## 2018-08-02 MED ORDER — HEPARIN SODIUM (PORCINE) 1000 UNIT/ML IJ SOLN
INTRAMUSCULAR | Status: AC
  Filled 2018-08-02: qty 1

## 2018-08-02 MED ORDER — LABETALOL HCL 5 MG/ML IV SOLN: 10.0000 mg | INTRAVENOUS | Status: AC | PRN

## 2018-08-02 MED ORDER — LORATADINE 10 MG PO TABS
10.0000 mg | ORAL_TABLET | Freq: Every day | ORAL | Status: DC
  Administered 2018-08-03: 10 mg via ORAL
  Filled 2018-08-02: qty 1

## 2018-08-02 MED ORDER — SODIUM CHLORIDE 0.9% FLUSH
3.0000 mL | Freq: Two times a day (BID) | INTRAVENOUS | Status: DC
  Administered 2018-08-03: 3 mL via INTRAVENOUS

## 2018-08-02 MED ORDER — TECHNETIUM TC 99M TETROFOSMIN IV KIT
30.0000 | PACK | Freq: Once | INTRAVENOUS | Status: AC
  Administered 2018-08-02: 31 via INTRAVENOUS

## 2018-08-02 MED ORDER — TECHNETIUM TC 99M TETROFOSMIN IV KIT
10.0000 | PACK | Freq: Once | INTRAVENOUS | Status: AC | PRN
  Administered 2018-08-02: 10.93 via INTRAVENOUS

## 2018-08-02 MED ORDER — REGADENOSON 0.4 MG/5ML IV SOLN
INTRAVENOUS | Status: AC
  Administered 2018-08-02: 0.4 mg via INTRAVENOUS
  Filled 2018-08-02: qty 5

## 2018-08-02 MED ORDER — ASPIRIN 81 MG PO CHEW: 81.0000 mg | CHEWABLE_TABLET | Freq: Every day | ORAL | Status: DC

## 2018-08-02 MED ORDER — METOPROLOL TARTRATE 12.5 MG HALF TABLET: 12.5000 mg | ORAL_TABLET | Freq: Two times a day (BID) | ORAL | Status: DC

## 2018-08-02 MED ORDER — MIDAZOLAM HCL 2 MG/2ML IJ SOLN
INTRAMUSCULAR | Status: AC
  Filled 2018-08-02: qty 2

## 2018-08-02 MED ORDER — ASPIRIN EC 81 MG PO TBEC
81.0000 mg | DELAYED_RELEASE_TABLET | Freq: Every day | ORAL | Status: DC
  Administered 2018-08-03: 81 mg via ORAL
  Filled 2018-08-02: qty 1

## 2018-08-02 MED ORDER — ALPRAZOLAM 0.25 MG PO TABS
0.2500 mg | ORAL_TABLET | Freq: Two times a day (BID) | ORAL | Status: DC | PRN
  Administered 2018-08-03: 07:00:00 0.25 mg via ORAL
  Filled 2018-08-02: qty 1

## 2018-08-02 MED ORDER — SODIUM CHLORIDE 0.9 % IV SOLN: INTRAVENOUS | Status: AC

## 2018-08-02 SURGICAL SUPPLY — 20 items
BALLN SAPPHIRE 2.5X15 (BALLOONS) ×2
BALLN SAPPHIRE ~~LOC~~ 3.0X12 (BALLOONS) ×2 IMPLANT
BALLOON SAPPHIRE 2.5X15 (BALLOONS) ×1 IMPLANT
CATH 5FR JL3.5 JR4 ANG PIG MP (CATHETERS) ×2 IMPLANT
CATH LAUNCHER 6FR EBU3.5 (CATHETERS) ×2 IMPLANT
DEVICE RAD COMP TR BAND LRG (VASCULAR PRODUCTS) ×2 IMPLANT
ELECT DEFIB PAD ADLT CADENCE (PAD) ×2 IMPLANT
GLIDESHEATH SLEND SS 6F .021 (SHEATH) ×2 IMPLANT
GUIDEWIRE INQWIRE 1.5J.035X260 (WIRE) ×1 IMPLANT
INQWIRE 1.5J .035X260CM (WIRE) ×2
KIT ENCORE 26 ADVANTAGE (KITS) ×2 IMPLANT
KIT HEART LEFT (KITS) ×2 IMPLANT
KIT HEMO VALVE WATCHDOG (MISCELLANEOUS) ×2 IMPLANT
PACK CARDIAC CATHETERIZATION (CUSTOM PROCEDURE TRAY) ×2 IMPLANT
SHEATH PROBE COVER 6X72 (BAG) ×2 IMPLANT
STENT SYNERGY DES 2.5X20 (Permanent Stent) ×2 IMPLANT
STENT SYNERGY DES 2.75X20 (Permanent Stent) ×2 IMPLANT
TRANSDUCER W/STOPCOCK (MISCELLANEOUS) ×2 IMPLANT
TUBING CIL FLEX 10 FLL-RA (TUBING) ×2 IMPLANT
WIRE ASAHI PROWATER 180CM (WIRE) ×2 IMPLANT

## 2018-08-02 NOTE — ED Notes (Signed)
CRITICAL VALUE ALERT  Critical Value:  Troponin 0.07  Date & Time Notied:  08/02/2018 1043  Provider Notified: Dr. Lacinda Axon   Orders Received/Actions taken: None yet

## 2018-08-02 NOTE — Progress Notes (Signed)
ANTICOAGULATION CONSULT NOTE - Initial Consult  Pharmacy Consult for heparin Indication: ACS/STEMI  Allergies  Allergen Reactions  . Other     Some type of cholesterol medication, unknown what type that caused muscle cramps in his legs lipitor    Patient Measurements: Height: 6\' 2"  (188 cm) Weight: 260 lb (117.9 kg) IBW/kg (Calculated) : 82.2 Heparin Dosing Weight: 107 kg  Vital Signs: Temp: 97.9 F (36.6 C) (06/12 1017) Temp Source: Oral (06/12 1017) BP: 143/100 (06/12 1016) Pulse Rate: 89 (06/12 1015)  Labs: Recent Labs    08/02/18 1018  HGB 16.3  HCT 47.4  PLT 313  CREATININE 0.82  TROPONINI 0.07*    Estimated Creatinine Clearance: 147.1 mL/min (by C-G formula based on SCr of 0.82 mg/dL).   Medical History: Past Medical History:  Diagnosis Date  . Hypertension     Medications:  (Not in a hospital admission)   Assessment: Pharmacy consulted to dose heparin in patient with ACS/STEMI.  Patient not on anticoagulation prior to admission.  Goal of Therapy:  Heparin level 0.3-0.7 units/ml Monitor platelets by anticoagulation protocol: Yes   Plan:  Give 4000 units bolus x 1 Start heparin infusion at 1400 units/hr Check anti-Xa level in 6 hours and daily while on heparin Continue to monitor H&H and platelets  Revonda Standard Angeliz Settlemyre 08/02/2018,10:46 AM

## 2018-08-02 NOTE — ED Triage Notes (Addendum)
Pt brought over by PA from cardiac rehab. Pt was having stress test and began having chest pain and EKG changes. Pt states she became nauseated at the time. Denies pain  Pt reports that he had been having intermittent cp for 3 weeks  Given 1 nitro with relief

## 2018-08-02 NOTE — ED Notes (Signed)
Pt denies any chest pain at this time. Pt reports his chest pain resolved a few minutes after taking the Nitro SL tablet. Dr. Lacinda Axon notified and said to hold Nitroglycerin drip at this time.

## 2018-08-02 NOTE — ED Provider Notes (Signed)
[facility] EMERGENCY DEPARTMENT Provider Note   CSN: [id number] Arrival date & time: 08/02/18  1012    History   Chief Complaint Chief Complaint  Patient presents with  . Chest Pain    HPI Jordan Gross is a 53 y.o. male.     Level 5 caveat for urgency of condition.  Patient was in process this morning with an outpatient cardiac stress test.  He had chest pain and dyspnea during this event and an abnormal EKG (ST depression in 2, 3, F, V5, V6; ST elevation in aVR and V1).  Patient was given a nitroglycerin tablet and transferred to the emergency department.  He is now hemodynamically stable.  Chest pain has improved.  Cardiac risk factors include hypertension.  No smoking or diabetes.  Uncertain lipid history.     Past Medical History:  Diagnosis Date  . Hypertension     There are no active problems to display for this patient.   History reviewed. No pertinent surgical history.      Home Medications    Prior to Admission medications   Medication Sig Start Date End Date Taking? Authorizing Provider  amLODipine (NORVASC) 10 MG tablet Take 10 mg by mouth daily.    [provider]  aspirin EC 81 MG tablet Take 81 mg by mouth daily.    [provider]  cetirizine (ZYRTEC) 10 MG tablet Take 10 mg by mouth daily as needed for allergies.    [provider]  hydrochlorothiazide (HYDRODIURIL) 25 MG tablet Take 25 mg by mouth daily.    [provider]  losartan (COZAAR) 100 MG tablet Take 100 mg by mouth daily.    [provider]    Family History Family History  Problem Relation Age of Onset  . Heart attack Mother 35    Social History Social History   Tobacco Use  . Smoking status: Former Games developer  . Smokeless tobacco: Never Used  Substance Use Topics  . Alcohol use: Yes    Alcohol/week: 70.0 standard drinks    Types: 70 Cans of beer per week    Comment: 10 beers daily in the evening  . Drug use: Never      Allergies   Other   Review of Systems Review of Systems  Unable to perform ROS: Acuity of condition     Physical Exam Updated Vital Signs BP (!) 143/100 (BP Location: Left Arm)   Pulse 89   Temp 97.9 F (36.6 C) (Oral)   Resp 15   Ht 6\' 2"  (1.88 m)   Wt 117.9 kg   SpO2 95%   BMI 33.38 kg/m   Physical Exam Vitals signs and nursing note reviewed.  Constitutional:      Appearance: He is well-developed.  HENT:     Head: Normocephalic and atraumatic.  Eyes:     Conjunctiva/sclera: Conjunctivae normal.  Neck:     Musculoskeletal: Neck supple.  Cardiovascular:     Rate and Rhythm: Normal rate and regular rhythm.  Pulmonary:     Effort: Pulmonary effort is normal.     Breath sounds: Normal breath sounds.  Abdominal:     General: Bowel sounds are normal.     Palpations: Abdomen is soft.  Musculoskeletal: Normal range of motion.  Skin:    General: Skin is warm and dry.  Neurological:     Mental Status: He is alert and oriented to person, place, and time.  Psychiatric:        Behavior: Behavior  normal.      ED Treatments / Results  Labs (all labs ordered are listed, but only abnormal results are displayed) Labs Reviewed  BASIC METABOLIC PANEL - Abnormal; Notable for the following components:      Result Value   Glucose, Bld 135 (*)    All other components within normal limits  TROPONIN I - Abnormal; Notable for the following components:   Troponin I 0.07 (*)    All other components within normal limits  SARS CORONAVIRUS 2 (HOSPITAL ORDER, [facility] [facility])  CBC WITH DIFFERENTIAL/PLATELET    EKG EKG Interpretation  Date/Time:  Friday August 02 2018 10:13:30 EDT Ventricular Rate:  87 PR Interval:    QRS Duration: 97 QT Interval:  367 QTC Calculation: 442 R Axis:   72 Text Interpretation:  Sinus rhythm Borderline repolarization abnormality Confirmed by Nat Christen [phone]) on 08/02/2018 10:22:10 AM   Radiology Dg Chest Port 1 View   Result Date: 08/02/2018 CLINICAL DATA:  Chest pain during stress test. EXAM: PORTABLE CHEST 1 VIEW COMPARISON:  07/23/2018 FINDINGS: The heart size and mediastinal contours are within normal limits. Both lungs are clear. The visualized skeletal structures are unremarkable. IMPRESSION: No active disease. Electronically Signed   By: Nelson Chimes M.D.   On: 08/02/2018 10:37    Procedures Procedures (including critical care time)  Medications Ordered in ED Medications  nitroGLYCERIN 50 mg in dextrose 5 % 250 mL (0.2 mg/mL) infusion (0 mcg/min Intravenous Hold 08/02/18 1042)  aspirin tablet 325 mg (325 mg Oral Given 08/02/18 1025)  heparin injection 4,000 Units (4,000 Units Intravenous Given 08/02/18 1039)     Initial Impression / Assessment and Plan / ED Course  I have reviewed the triage vital signs and the nursing notes.  Pertinent labs & imaging results that were available during my care of the patient were reviewed by me and considered in my medical decision making (see chart for details).        Patient presents with chest pain and abnormal EKG during a stress test.  EKG has improved with nitroglycerin.  I initiated a code STEMI and discussed clinical scenario with Dr. Irish Lack.  Heparin and aspirin initiated.  Nitroglycerin drip discussed, but patient is pain-free at this time.  Patient will be transferred urgently to [facility] for a cardiac catheterization.  He is hemodynamically stable at time of answer.  CRITICAL CARE Performed by: Nat Christen Total critical care time: 40 minutes Critical care time was exclusive of separately billable procedures and treating other patients. Critical care was necessary to treat or prevent imminent or life-threatening deterioration. Critical care was time spent personally by me on the following activities: development of treatment plan with patient and/or surrogate as well as nursing, discussions with consultants, evaluation of patient's response to  treatment, examination of patient, obtaining history from patient or surrogate, ordering and performing treatments and interventions, ordering and review of laboratory studies, ordering and review of radiographic studies, pulse oximetry and re-evaluation of patient's condition.  Final Clinical Impressions(s) / ED Diagnoses   Final diagnoses:  Chest pain, unspecified type    ED Discharge Orders    None       Nat Christen, MD 08/02/18 1058

## 2018-08-02 NOTE — Interval H&P Note (Signed)
Cath Lab Visit (complete for each Cath Lab visit)  Clinical Evaluation Leading to the Procedure:   ACS: Yes.    Non-ACS:    Anginal Classification: CCS IV  Anti-ischemic medical therapy: Minimal Therapy (1 class of medications)  Non-Invasive Test Results: No non-invasive testing performed  Prior CABG: No previous CABG    Transient ST elevation and chest pain during stress test.   History and Physical Interval Note:  08/02/2018 11:50 AM  Jordan Gross  has presented today for surgery, with the diagnosis of stemi.  The various methods of treatment have been discussed with the patient and family. After consideration of risks, benefits and other options for treatment, the patient has consented to  Procedure(s): CORONARY/GRAFT ACUTE MI REVASCULARIZATION (N/A) as a surgical intervention.  The patient's history has been reviewed, patient examined, no change in status, stable for surgery.  I have reviewed the patient's chart and labs.  Questions were answered to the patient's satisfaction.     Larae Grooms

## 2018-08-02 NOTE — ED Notes (Signed)
Pt denies CP at this time 

## 2018-08-02 NOTE — H&P (Addendum)
Cardiology Admission History and Physical:   Patient ID: Jordan Gross; MRN: [id number]; DOB: 1965/04/28   Admission date: 08/02/2018  Primary Care Provider: Ignatius SpeckingVyas, Dhruv B, MD Primary Cardiologist: Dina RichBranch, Jonathan, MD 07/31/2018 Primary Electrophysiologist: None  Chief Complaint: Unstable angina  Patient Profile:   Jordan Gross is a 53 y.o. male with a history of hypertension, no previous cardiac issues.  History of Present Illness:   Jordan Gross went to the emergency room on 07/23/2018 for chest pain.  He was evaluated there but he was not acute and he was referred to cardiology as an outpatient.  He was seen by Dr. [name] on 07/31/2018 and a Lexiscan Myoview was ordered.  Jordan Gross came to the hospital on 6/12 for the Myoview.  During administration of the Lexiscan, he had significant ECG changes.  He also had chest pain up to a 7/10.  His blood pressure was very high, 180/112.  He had taken his blood pressure medications this a.m.  He was given sublingual nitroglycerin x1 and his chest pain and ECG changes improved.    Because of the ECG changes and chest pain, he was transported to the emergency room with plans to transfer [facility].   Past Medical History:  Diagnosis Date  . Hypertension   . Unstable angina (HCC) 08/02/2018    History reviewed. No pertinent surgical history.   Medications Prior to Admission: Prior to Admission medications   Medication Sig Start Date End Date Taking? Authorizing Provider  amLODipine (NORVASC) 10 MG tablet Take 10 mg by mouth daily.    [provider]  aspirin EC 81 MG tablet Take 81 mg by mouth daily.    [provider]  cetirizine (ZYRTEC) 10 MG tablet Take 10 mg by mouth daily as needed for allergies.    [provider]  hydrochlorothiazide (HYDRODIURIL) 25 MG tablet Take 25 mg by mouth daily.    [provider]  losartan (COZAAR) 100 MG tablet Take 100 mg by mouth daily.    [provider]     Allergies:    Allergies  Allergen Reactions  . Other     Some type of cholesterol medication, unknown what type that caused muscle cramps in his legs lipitor    Social History:   Social History   Socioeconomic History  . Marital status: Married    Spouse name: Not on file  . Number of children: Not on file  . Years of education: Not on file  . Highest education level: Not on file  Occupational History    Employer: [employer]  Social Needs  . Financial resource strain: Not on file  . Food insecurity    Worry: Not on file    Inability: Not on file  . Transportation needs    Medical: Not on file    Non-medical: Not on file  Tobacco Use  . Smoking status: Former Games developermoker  . Smokeless tobacco: Never Used  Substance and Sexual Activity  . Alcohol use: Yes    Alcohol/week: 70.0 standard drinks    Types: 70 Cans of beer per week    Comment: 10 beers daily in the evening  . Drug use: Never  . Sexual activity: Not on file  Lifestyle  . Physical activity    Days per week: Not on file    Minutes per session: Not on file  . Stress: Not on file  Relationships  . Social connections    Talks on phone: Not on  file    Gets together: Not on file    Attends religious service: Not on file    Active member of club or organization: Not on file    Attends meetings of clubs or organizations: Not on file    Relationship status: Not on file  . Intimate partner violence    Fear of current or ex partner: Not on file    Emotionally abused: Not on file    Physically abused: Not on file    Forced sexual activity: Not on file  Other Topics Concern  . Not on file  Social History Narrative  . Not on file    Family History:   The patient's family history includes Heart attack (age of onset: 36) in his mother.   The patient He indicated that his mother is deceased.    ROS:  Please see the history of present illness.  All other ROS reviewed and negative.      Physical Exam/Data:   Vitals:   08/02/18 1016 08/02/18 1017 08/02/18 1030 08/02/18 1045  BP: (!) 143/100  (!) 140/94   Pulse:   93 95  Resp:   18 (!) 22  Temp:  97.9 F (36.6 C)    TempSrc:  Oral    SpO2:   98% 98%  Weight:      Height:       No intake or output data in the 24 hours ending 08/02/18 1106 Filed Weights   08/02/18 1013  Weight: 117.9 kg   Body mass index is 33.38 kg/m.  General:  Well nourished, well developed, in acute distress HEENT: normal Lymph: no adenopathy Neck:  JVD not elevated Endocrine:  No thryomegaly Vascular: No carotid bruits; 4/4 extremity pulses 2+ bilaterally  Cardiac:  normal S1, S2; RRR; no murmur, no rub or gallop  Lungs:  clear to auscultation bilaterally, no wheezing, rhonchi or rales  Abd: soft, nontender, no hepatomegaly  Ext: No edema Musculoskeletal:  No deformities, BUE and BLE strength normal and equal Skin: warm and dry  Neuro:  CNs 2-12 intact, no focal abnormalities noted Psych:  Normal affect    EKG:  The ECG that was done today was personally reviewed and demonstrates sinus rhythm, ST elevation in aVR, V1-V3 with ST depression in inferolateral leads  Relevant CV Studies:  None  Laboratory Data:  Chemistry Recent Labs  Lab 08/02/18 1018  NA 138  K 3.5  CL 103  CO2 23  GLUCOSE 135*  BUN 14  CREATININE 0.82  CALCIUM 9.3  GFRNONAA >60  GFRAA >60  ANIONGAP 12     Hematology Recent Labs  Lab 08/02/18 1018  WBC 8.9  RBC 5.35  HGB 16.3  HCT 47.4  MCV 88.6  MCH 30.5  MCHC 34.4  RDW 12.8  PLT 313   Cardiac Enzymes Recent Labs  Lab 08/02/18 1018  TROPONINI 0.07*   No results for input(s): TROPIPOC in the last 168 hours.   Radiology/Studies:  Dg Chest Port 1 View  Result Date: 08/02/2018 CLINICAL DATA:  Chest pain during stress test. EXAM: PORTABLE CHEST 1 VIEW COMPARISON:  07/23/2018 FINDINGS: The heart size and mediastinal contours are within normal limits. Both lungs are clear. The visualized  skeletal structures are unremarkable. IMPRESSION: No active disease. Electronically Signed   By: Nelson Chimes M.D.   On: 08/02/2018 10:37    Assessment and Plan:   1.  Unstable angina: - he has significant EKG changes and chest pain during the stress portion  of a Myoview - Although his chest pain improved with nitroglycerin, his ECG still has some abnormalities with new anterior T wave inversions concerning for Well[facility] - Labs were drawn including a 2-hour COVID test, results pending - Code STEMI was activated because of ongoing symptoms. - The risks and benefits of a cardiac catheterization including, but not limited to, death, stroke, MI, kidney damage and bleeding were discussed with the patient who indicates understanding and agrees to proceed.  - he is being transferred emergently to [facility] for further evaluation and treatment  Principal Problem:   Unstable angina (HCC) Active Problems:   HTN (hypertension)    For questions or updates, please contact [facility] Please consult [email or web address] for contact info under Cardiology/STEMI.    Signed, Theodore Demark, PA-C  08/02/2018 11:06 AM   The patient was seen and examined, and I agree with the history, physical exam, assessment and plan as documented above, with modifications as noted below. I have also personally reviewed all relevant documentation, old records, labs, and both radiographic and cardiovascular studies. I have also independently interpreted old and new ECG's.  Briefly, this is a 53 year old male with a history of hypertension, prior history of tobacco use, and family history of premature coronary artery disease.  He was evaluated in the ED for chest pain on 07/23/2018.  He eventually did a telehealth visit with Dr. Wyline Mood who ordered a nuclear stress test.  He presented today for stress testing.  Initial ECG demonstrates sinus rhythm with nonspecific ST segment abnormalities inferiorly and precordial leads.   Blood pressure was 158/99 at rest.  Upon stress test commencement, he experienced severe chest pain and a markedly elevated blood pressure of 189/108.  There were diffuse 3-4 mm ST segment depressions inferiorly and leads V5 and V6.  There was 1 mm ST segment elevations in leads V1 through V3.  He received 1 sublingual nitroglycerin with alleviation of chest pain.  Roughly 12 minutes into recovery, ST segment changes reverted to baseline.  There were biphasic T waves in leads V2 and T wave inversions in V3 and V4.  Overall presentation worrisome for unstable angina.  He will be transferred to [facility] for coronary angiography.   Risks and benefits of cardiac catheterization have been discussed with the patient.  These include bleeding, infection, kidney damage, stroke, heart attack, death.  The patient understands these risks and is willing to proceed.  Prentice Docker, MD, [facility]  08/02/2018 11:23 AM

## 2018-08-02 NOTE — Progress Notes (Signed)
  Echocardiogram 2D Echocardiogram has been performed.  Randa Lynn Ulani Degrasse 08/02/2018, 3:40 PM

## 2018-08-03 ENCOUNTER — Encounter (HOSPITAL_COMMUNITY): Payer: Self-pay | Admitting: Cardiology

## 2018-08-03 DIAGNOSIS — E785 Hyperlipidemia, unspecified: Secondary | ICD-10-CM | POA: Diagnosis present

## 2018-08-03 DIAGNOSIS — Z95820 Peripheral vascular angioplasty status with implants and grafts: Secondary | ICD-10-CM

## 2018-08-03 DIAGNOSIS — I249 Acute ischemic heart disease, unspecified: Secondary | ICD-10-CM

## 2018-08-03 HISTORY — DX: Hyperlipidemia, unspecified: E78.5

## 2018-08-03 LAB — COMPREHENSIVE METABOLIC PANEL
ALT: 53 U/L — ABNORMAL HIGH (ref 0–44)
AST: 34 U/L (ref 15–41)
Albumin: 3.9 g/dL (ref 3.5–5.0)
Alkaline Phosphatase: 81 U/L (ref 38–126)
Anion gap: 11 (ref 5–15)
BUN: 10 mg/dL (ref 6–20)
CO2: 21 mmol/L — ABNORMAL LOW (ref 22–32)
Calcium: 9.3 mg/dL (ref 8.9–10.3)
Chloride: 105 mmol/L (ref 98–111)
Creatinine, Ser: 0.74 mg/dL (ref 0.61–1.24)
GFR calc Af Amer: >60 mL/min (ref >60)
GFR calc non Af Amer: >60 mL/min (ref >60)
Glucose, Bld: 126 mg/dL — ABNORMAL HIGH (ref 70–99)
Potassium: 3.4 mmol/L — ABNORMAL LOW (ref 3.5–5.1)
Sodium: 137 mmol/L (ref 135–145)
Total Bilirubin: 1.1 mg/dL (ref 0.3–1.2)
Total Protein: 6.9 g/dL (ref 6.5–8.1)

## 2018-08-03 LAB — CBC
HCT: 44.6 % (ref 39.0–52.0)
Hemoglobin: 15.7 g/dL (ref 13.0–17.0)
MCH: 30.7 pg (ref 26.0–34.0)
MCHC: 35.2 g/dL (ref 30.0–36.0)
MCV: 87.3 fL (ref 80.0–100.0)
Platelets: 291 10*3/uL (ref 150–400)
RBC: 5.11 MIL/uL (ref 4.22–5.81)
RDW: 12.9 % (ref 11.5–15.5)
WBC: 10.7 10*3/uL — ABNORMAL HIGH (ref 4.0–10.5)
nRBC: 0 % (ref 0.0–0.2)

## 2018-08-03 LAB — TROPONIN I: Troponin I: 0.97 ng/mL (ref <0.03)

## 2018-08-03 LAB — LIPID PANEL
Cholesterol: 201 mg/dL — ABNORMAL HIGH (ref 0–200)
HDL: 33 mg/dL — ABNORMAL LOW (ref >40)
LDL Cholesterol: 138 mg/dL — ABNORMAL HIGH (ref 0–99)
Total CHOL/HDL Ratio: 6.1 RATIO
Triglycerides: 152 mg/dL — ABNORMAL HIGH (ref <150)
VLDL: 30 mg/dL (ref 0–40)

## 2018-08-03 LAB — HIV ANTIBODY (ROUTINE TESTING W REFLEX): HIV Screen 4th Generation wRfx: NONREACTIVE

## 2018-08-03 MED ORDER — ACETAMINOPHEN 325 MG PO TABS: 650.0000 mg | ORAL_TABLET | ORAL | Status: DC | PRN

## 2018-08-03 MED ORDER — POTASSIUM CHLORIDE CRYS ER 20 MEQ PO TBCR
20.0000 meq | EXTENDED_RELEASE_TABLET | Freq: Once | ORAL | Status: AC
  Administered 2018-08-03: 20 meq via ORAL
  Filled 2018-08-03: qty 1

## 2018-08-03 MED ORDER — ROSUVASTATIN CALCIUM 20 MG PO TABS: 20.0000 mg | ORAL_TABLET | Freq: Every day | ORAL | 6 refills | Status: DC

## 2018-08-03 MED ORDER — CARVEDILOL 3.125 MG PO TABS: 3.1250 mg | ORAL_TABLET | Freq: Two times a day (BID) | ORAL | 6 refills | Status: DC

## 2018-08-03 MED ORDER — CARVEDILOL 3.125 MG PO TABS
3.1250 mg | ORAL_TABLET | Freq: Two times a day (BID) | ORAL | Status: DC
  Administered 2018-08-03: 3.125 mg via ORAL
  Filled 2018-08-03 (×2): qty 1

## 2018-08-03 MED ORDER — TICAGRELOR 90 MG PO TABS: 90.0000 mg | ORAL_TABLET | Freq: Two times a day (BID) | ORAL | 4 refills | Status: DC

## 2018-08-03 MED ORDER — NITROGLYCERIN 0.4 MG SL SUBL: 0.4000 mg | SUBLINGUAL_TABLET | SUBLINGUAL | 4 refills | Status: DC | PRN

## 2018-08-03 NOTE — Discharge Summary (Signed)
Discharge Summary    Patient ID: Jordan Gross MRN: [id number]; DOB: 09-02-1965  Admit date: 08/02/2018 Discharge date: 08/03/2018  Primary Care Provider: Glenda Chroman, MD  Primary Cardiologist: Carlyle Dolly, MD  Primary Electrophysiologist:  None   Discharge Diagnoses    Principal Problem:   ST elevation myocardial infarction involving left anterior descending (LAD) coronary artery [facility]) Active Problems:   S/P angioplasty with stent emergently 08/02/18 with DES to LAD and DES to both branches of Ramus    Unstable angina (HCC)   HTN (hypertension)   Acute coronary syndrome (HCC)   HLD (hyperlipidemia)   Allergies Allergies  Allergen Reactions  . Other     Some type of cholesterol medication, unknown what type that caused muscle cramps in his legs lipitor    Diagnostic Studies/Procedures    Cardiac cath 08/02/18  Prox LAD lesion is 95% stenosed.  Ramus lesion is 75% stenosed.  Lat Ramus lesion is 50% stenosed.  Mid LM lesion is 10% stenosed.  A drug-eluting stent was successfully placed using a STENT SYNERGY DES 2.75X20.  Post intervention, there is a 0% residual stenosis.  A drug-eluting stent was successfully placed using a STENT SYNERGY DES 2.5X20.  Post intervention, there is a 0% residual stenosis.  The left ventricular systolic function is normal. Mild mid anterior hypokinesis.  LV end diastolic pressure is normal. LVEDP 10 mm Hg.  The left ventricular ejection fraction is 50-55% by visual estimate.   Continue with aggressive secondary prevention.  He will need DAPT for 12 months.  Consider clopidogrel monotherapy after 12 months.   Intolerant of atorvastatin.  Will start Crestor 20 mg daily.  Lipid lowering therapy will be important going forward.  Diagnostic Dominance: Right  Intervention    _____________    Echo 08/02/18 IMPRESSIONS    1. The left ventricle has normal systolic function with an ejection fraction of 60-65%. The  cavity size was normal. There is mildly increased left ventricular wall thickness. Left ventricular diastolic Doppler parameters are consistent with impaired  relaxation.  2. The right ventricle has normal systolic function. The cavity was normal. There is no increase in right ventricular wall thickness.  3. The aortic root and ascending aorta are normal in size and structure.  4. The interatrial septum was not assessed.  FINDINGS  Left Ventricle: The left ventricle has normal systolic function, with an ejection fraction of 60-65%. The cavity size was normal. There is mildly increased left ventricular wall thickness. Left ventricular diastolic Doppler parameters are consistent  with impaired relaxation.  Right Ventricle: The right ventricle has normal systolic function. The cavity was normal. There is no increase in right ventricular wall thickness.  Left Atrium: Left atrial size was normal in size.  Right Atrium: Right atrial size was not assessed. Right atrial pressure is estimated at 3 mmHg.  Interatrial Septum: The interatrial septum was not assessed.  Pericardium: There is no evidence of pericardial effusion.  Mitral Valve: The mitral valve is normal in structure. Mitral valve regurgitation is not visualized by color flow Doppler.  Tricuspid Valve: The tricuspid valve is normal in structure. Tricuspid valve regurgitation is trivial by color flow Doppler.  Aortic Valve: The aortic valve is normal in structure. Aortic valve regurgitation was not visualized by color flow Doppler.  Pulmonic Valve: The pulmonic valve was not assessed. Pulmonic valve regurgitation is not visualized by color flow Doppler.  Aorta: The aortic root and ascending aorta are normal in size and structure.  History of Present Illness     [age] yoM with HTN had chest pain on 07/23/18 and evaluated in ER with neg troponin and then seen by Dr. [name] and [name] ordered- lexi  With lexiscan he  developed acute EKG changes in Ant leads with ST elevation.. he also had significant chest pain.  Test was stopped and [facility].    Hospital Course     Consultants: none   Cardiac cath with 95% LAD lesion and Ramus lesion of 75% stenosis. And Rt ramus of 50%.  DES placed LAD and Lt and Rt ramus branches. He tolerated procedure well and today feels great.  He did receive some IV hydralazine with slight reaction at site. Including SOB.    Continue DAPT for 12 months.  He has not tolerated lipitor in past so Crestor was started.    He will follow up in 5-7 days in the office.  Will keep out of work until office visit- he is a Designer, fashion/clothingroofer.  The SOB may be releated to Brilinta  He has been asked to take with caffeine and plan will be to give it a week if still SOB would most likely change to plavix.   Pk troponin of 1.24. EF normal.  LDL 124    COVID negative.  _____________  Discharge Vitals Blood pressure (!) 144/101, pulse 93, temperature 97.8 F (36.6 C), temperature source Oral, resp. rate 18, height 6\' 2"  (1.88 m), weight 116 kg, SpO2 95 %.  Filed Weights   08/02/18 1013 08/03/18 0511  Weight: 117.9 kg 116 kg    Labs & Radiologic Studies    CBC Recent Labs    08/02/18 1018 08/03/18 0211  WBC 8.9 10.7*  NEUTROABS 6.5  --   HGB 16.3 15.7  HCT 47.4 44.6  MCV 88.6 87.3  PLT 313 291   Basic Metabolic Panel Recent Labs    09/81/1905/02/08 1018 08/03/18 0211  NA 138 137  K 3.5 3.4*  CL 103 105  CO2 23 21*  GLUCOSE 135* 126*  BUN 14 10  CREATININE 0.82 0.74  CALCIUM 9.3 9.3   Liver Function Tests Recent Labs    08/03/18 0211  AST 34  ALT 53*  ALKPHOS 81  BILITOT 1.1  PROT 6.9  ALBUMIN 3.9   No results for input(s): LIPASE, AMYLASE in the last 72 hours. Cardiac Enzymes Recent Labs    08/02/18 1431 08/02/18 2004 08/03/18 0211  TROPONINI 0.37* 1.24* 0.97*   BNP Invalid input(s): POCBNP D-Dimer No results for input(s): DDIMER in the last  72 hours. Hemoglobin A1C Recent Labs    08/02/18 1431  HGBA1C 5.9*   Fasting Lipid Panel Recent Labs    08/03/18 0211  CHOL 201*  HDL 33*  LDLCALC 138*  TRIG 152*  CHOLHDL 6.1   Thyroid Function Tests Recent Labs    08/02/18 1431  TSH 2.664   _____________  Dg Chest Port 1 View  Result Date: 08/02/2018 CLINICAL DATA:  Chest pain during stress test. EXAM: PORTABLE CHEST 1 VIEW COMPARISON:  07/23/2018 FINDINGS: The heart size and mediastinal contours are within normal limits. Both lungs are clear. The visualized skeletal structures are unremarkable. IMPRESSION: No active disease. Electronically Signed   By: Paulina FusiMark  Shogry M.D.   On: 08/02/2018 10:37   Dg Chest Port 1 View  Result Date: 07/23/2018 CLINICAL DATA:  Intermittent chest pain. EXAM: PORTABLE CHEST 1 VIEW COMPARISON:  No prior. FINDINGS: Mediastinum and hilar structures normal. Cardiomegaly  with normal pulmonary vascularity. Low lung volumes. Mild peribronchial cuffing noted. Bronchitis cannot be excluded. No focal infiltrate. No pleural effusion or pneumothorax. No acute bony abnormality. IMPRESSION: Low lung volumes. Mild peribronchial cuffing. Bronchitis cannot be excluded. Electronically Signed   By: Maisie Fushomas  Register   On: 07/23/2018 08:10   Disposition   Pt is being discharged home today in good condition.  Follow-up Plans & Appointments   Heart Healthy diet  For chest pain Take 1 NTG, under your tongue, while sitting.  If no relief of pain may repeat NTG, one tab every 5 minutes up to 3 tablets total over 15 minutes.  If no relief CALL 911.  If you have dizziness/lightheadness  while taking NTG, stop taking and call 911.        Call [facility] [address] at [phone] if any bleeding, swelling or drainage at cath site.  May shower, no tub baths for 48 hours for groin sticks. No lifting over 5 pounds for 7 days.  No Driving for 5 days  No work until seen in the office.    The office will call  Monday for follow up appt date and time.  Do not stop Brilinta or asprin.  These keep the stents open.  Call if problems in obtaining.   Follow-up Information    Branch, Dorothe PeaJonathan F, MD Follow up.   Specialty: Cardiology Why: the office will call you on Monday to arrange follow up Contact information: [address] [place] [address] [phone]          Discharge Instructions    Amb Referral to Cardiac Rehabilitation   Complete by: As directed    Diagnosis: Coronary Stents   After initial evaluation and assessments completed: Virtual Based Care may be provided alone or in conjunction with Phase 2 Cardiac Rehab based on patient barriers.: Yes      Discharge Medications   Allergies as of 08/03/2018      Reactions   Other    Some type of cholesterol medication, unknown what type that caused muscle cramps in his legs lipitor      Medication List    TAKE these medications   acetaminophen 325 MG tablet Commonly known as: TYLENOL Take 2 tablets (650 mg total) by mouth every 4 (four) hours as needed for headache or mild pain.   amLODipine 10 MG tablet Commonly known as: NORVASC Take 10 mg by mouth daily.   aspirin EC 81 MG tablet Take 81 mg by mouth daily.   carvedilol 3.125 MG tablet Commonly known as: COREG Take 1 tablet (3.125 mg total) by mouth 2 (two) times daily with a meal.   cetirizine 10 MG tablet Commonly known as: ZYRTEC Take 10 mg by mouth daily as needed for allergies.   clonazePAM 1 MG tablet Commonly known as: KLONOPIN Take 1 mg by mouth at bedtime as needed for anxiety.   hydrochlorothiazide 25 MG tablet Commonly known as: HYDRODIURIL Take 25 mg by mouth daily.   losartan 100 MG tablet Commonly known as: COZAAR Take 100 mg by mouth daily.   nitroGLYCERIN 0.4 MG SL tablet Commonly known as: NITROSTAT Place 1 tablet (0.4 mg total) under the tongue every 5 (five) minutes x 3 doses as needed for chest pain.   rosuvastatin 20 MG tablet  Commonly known as: CRESTOR Take 1 tablet (20 mg total) by mouth daily at 6 PM.   ticagrelor 90 MG Tabs tablet Commonly known as: BRILINTA Take 1 tablet (90  mg total) by mouth 2 (two) times daily.        Acute coronary syndrome (MI, NSTEMI, STEMI, etc) this admission?: Yes.     AHA/ACC Clinical Performance & Quality Measures: 1. Aspirin prescribed? - Yes 2. ADP Receptor Inhibitor (Plavix/Clopidogrel, Brilinta/Ticagrelor or Effient/Prasugrel) prescribed (includes medically managed patients)? - Yes 3. Beta Blocker prescribed? - Yes 4. High Intensity Statin (Lipitor 40-80mg  or Crestor 20-40mg ) prescribed? - Yes 5. EF assessed during THIS hospitalization? - Yes 6. For EF <40%, was ACEI/ARB prescribed? - Not Applicable (EF >/= 40%) 7. For EF <40%, Aldosterone Antagonist (Spironolactone or Eplerenone) prescribed? - Not Applicable (EF >/= 40%) 8. Cardiac Rehab Phase II ordered (Included Medically managed Patients)? - Yes     Outstanding Labs/Studies   Will need repeat hepatic and lipids in 6 weeks.   Duration of Discharge Encounter   Greater than 30 minutes including physician time.  Signed, Nada Boozer, NP 08/03/2018, 9:40 AM

## 2018-08-03 NOTE — Progress Notes (Signed)
Progress Note  Patient Name: Jordan Gross Date of Encounter: 08/03/2018  Primary Cardiologist: Dina Rich, MD   Subjective   He just received IV hydralazine and developed an adverse reaction with some mild shortness of breath.  Prior to this he had been feeling well and denied any chest pain or palpitations.  He had been feeling well enough to go home today.  Inpatient Medications    Scheduled Meds: . amLODipine  10 mg Oral Daily  . aspirin EC  81 mg Oral Daily  . loratadine  10 mg Oral Daily  . losartan  100 mg Oral Daily  . metoprolol tartrate  12.5 mg Oral BID  . rosuvastatin  20 mg Oral q1800  . sodium chloride flush  3 mL Intravenous Q12H  . sodium chloride flush  3 mL Intravenous Q12H  . ticagrelor  90 mg Oral BID   Continuous Infusions: . sodium chloride    . sodium chloride    . nitroGLYCERIN Stopped (08/02/18 1042)   PRN Meds: sodium chloride, sodium chloride, acetaminophen, ALPRAZolam, hydrALAZINE, nitroGLYCERIN, ondansetron (ZOFRAN) IV, sodium chloride flush, sodium chloride flush, zolpidem   Vital Signs    Vitals:   08/02/18 2128 08/03/18 0511 08/03/18 0615 08/03/18 0830  BP: (!) 141/107 (!) 180/112 (!) 143/76 (!) 144/101  Pulse: 78 78 81 93  Resp: (!) 22 18 12 18   Temp: 98.3 F (36.8 C) 97.8 F (36.6 C)    TempSrc: Oral Oral    SpO2: 95% 96% 95%   Weight:  116 kg    Height:        Intake/Output Summary (Last 24 hours) at 08/03/2018 0845 Last data filed at 08/03/2018 0700 Gross per 24 hour  Intake 1561.67 ml  Output -  Net 1561.67 ml   Filed Weights   08/02/18 1013 08/03/18 0511  Weight: 117.9 kg 116 kg    Telemetry    Sinus rhythm- Personally Reviewed  ECG    Sinus rhythm with precordial T wave inversions- Personally Reviewed  Physical Exam   GEN: No acute distress.   Neck: No JVD Cardiac: RRR, no murmurs, rubs, or gallops.  Respiratory: Clear to auscultation bilaterally. GI: Soft, nontender, non-distended  MS: No edema;  No deformity. Neuro:  Nonfocal  Psych: Normal affect   Labs    Chemistry Recent Labs  Lab 08/02/18 1018 08/03/18 0211  NA 138 137  K 3.5 3.4*  CL 103 105  CO2 23 21*  GLUCOSE 135* 126*  BUN 14 10  CREATININE 0.82 0.74  CALCIUM 9.3 9.3  PROT  --  6.9  ALBUMIN  --  3.9  AST  --  34  ALT  --  53*  ALKPHOS  --  81  BILITOT  --  1.1  GFRNONAA >60 >60  GFRAA >60 >60  ANIONGAP 12 11     Hematology Recent Labs  Lab 08/02/18 1018 08/03/18 0211  WBC 8.9 10.7*  RBC 5.35 5.11  HGB 16.3 15.7  HCT 47.4 44.6  MCV 88.6 87.3  MCH 30.5 30.7  MCHC 34.4 35.2  RDW 12.8 12.9  PLT 313 291    Cardiac Enzymes Recent Labs  Lab 08/02/18 1018 08/02/18 1431 08/02/18 2004 08/03/18 0211  TROPONINI 0.07* 0.37* 1.24* 0.97*   No results for input(s): TROPIPOC in the last 168 hours.   BNPNo results for input(s): BNP, PROBNP in the last 168 hours.   DDimer No results for input(s): DDIMER in the last 168 hours.   Radiology  Dg Chest Port 1 View  Result Date: 08/02/2018 CLINICAL DATA:  Chest pain during stress test. EXAM: PORTABLE CHEST 1 VIEW COMPARISON:  07/23/2018 FINDINGS: The heart size and mediastinal contours are within normal limits. Both lungs are clear. The visualized skeletal structures are unremarkable. IMPRESSION: No active disease. Electronically Signed   By: Nelson Chimes M.D.   On: 08/02/2018 10:37    Cardiac Studies   Echocardiogram 08/02/18   1. The left ventricle has normal systolic function with an ejection fraction of 60-65%. The cavity size was normal. There is mildly increased left ventricular wall thickness. Left ventricular diastolic Doppler parameters are consistent with impaired  relaxation.  2. The right ventricle has normal systolic function. The cavity was normal. There is no increase in right ventricular wall thickness.  3. The aortic root and ascending aorta are normal in size and structure.  4. The interatrial septum was not assessed.  Cath  08/02/18:   Prox LAD lesion is 95% stenosed.  Ramus lesion is 75% stenosed.  Lat Ramus lesion is 50% stenosed.  Mid LM lesion is 10% stenosed.  A drug-eluting stent was successfully placed using a STENT SYNERGY DES 2.75X20.  Post intervention, there is a 0% residual stenosis.  A drug-eluting stent was successfully placed using a STENT SYNERGY DES 2.5X20.  Post intervention, there is a 0% residual stenosis.  The left ventricular systolic function is normal. Mild mid anterior hypokinesis.  LV end diastolic pressure is normal. LVEDP 10 mm Hg.  The left ventricular ejection fraction is 50-55% by visual estimate.   Continue with aggressive secondary prevention.  He will need DAPT for 12 months.  Consider clopidogrel monotherapy after 12 months.   Intolerant of atorvastatin.  Will start Crestor 20 mg daily.  Lipid lowering therapy will be important going forward.  Patient Profile     53 y.o. male who developed chest pain and ischemic ECG abnormalities at the initiation of stress testing transferred to [facility] for coronary angiography and found to have obstructive coronary artery disease.  Assessment & Plan    1.  Unstable angina: Status post drug-eluting stent placement to the proximal LAD and ramus.  Symptomatically stable from the standpoint.  He will need dual antiplatelet therapy for minimum of 12 months.  Consider clopidogrel monotherapy after 12 months.  Currently on aspirin and Brilinta.  He previously did not tolerate atorvastatin has and has been started on Crestor 20 mg daily.  I will start carvedilol as it will provide better hypertension control.  2.  Hypertension: Most recent blood pressure is 146/98.  I will start carvedilol 3.125 mg twice daily.  He is already taking amlodipine 10 mg, hydrochlorothiazide 25 mg, and losartan 100 mg.  3.  Hyperlipidemia: LDL 138.  Crestor 20 mg daily started on 08/02/2018.  Disposition: After being evaluated by cardiac rehab, he will  be discharged home.  We will arrange for follow-up in [place].  For questions or updates, please contact [place] Please consult [email or web address] for contact info under Cardiology/STEMI.      Signed, Kate Sable, MD  08/03/2018, 8:45 AM

## 2018-08-03 NOTE — Discharge Instructions (Signed)
Heart Healthy diet  For chest pain Take 1 NTG, under your tongue, while sitting.  If no relief of pain may repeat NTG, one tab every 5 minutes up to 3 tablets total over 15 minutes.  If no relief CALL 911.  If you have dizziness/lightheadness  while taking NTG, stop taking and call 911.        Call [facility] at [phone] if any bleeding, swelling or drainage at cath site.  May shower, no tub baths for 48 hours for groin sticks. No lifting over 5 pounds for 7 days.  No Driving for 5 days  No work until seen in the office.    The office will call Monday for follow up appt date and time.  Do not stop Brilinta or asprin.  These keep the stents open.  Call if problems in obtaining.

## 2018-08-03 NOTE — Progress Notes (Signed)
Received call from RN regarding Brilinta referral. Met with pt and provided a 30 day free card and $5 each month card. He reports that he has prescription coverage through his medical insurance.

## 2018-08-03 NOTE — Progress Notes (Signed)
CARDIAC REHAB PHASE I   PRE:  Rate/Rhythm: Sinus 86  BP:  Supine: 146/98     SaO2: 94%  [id number] Did not ambulate patient this morning due to elevated BP. Reviewed risk factors, heart healthy diet and exercise instructions with patient. 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 Cecilie Kicks NP notified. Patient's RN notified about elevated blood pressure. Mr Grobe is interested in virtual cardiac rehab. Obtained e mail address. Reviewed importance of taking brillinta as presribed.  Harrell Gave RN BSN

## 2018-08-05 MED FILL — Tirofiban HCl in NaCl 0.9% IV Soln 5 MG/100ML (Base Equiv): INTRAVENOUS | Qty: 100 | Status: AC

## 2018-09-03 ENCOUNTER — Ambulatory Visit (INDEPENDENT_AMBULATORY_CARE_PROVIDER_SITE_OTHER): Payer: No Typology Code available for payment source | Admitting: Student

## 2018-09-03 ENCOUNTER — Other Ambulatory Visit: Payer: Self-pay

## 2018-09-03 ENCOUNTER — Encounter: Payer: Self-pay | Admitting: Student

## 2018-09-03 VITALS — BP 134/90 | HR 55 | Temp 97.0°F | Ht 72.0 in | Wt 239.0 lb

## 2018-09-03 DIAGNOSIS — Z87898 Personal history of other specified conditions: Secondary | ICD-10-CM | POA: Diagnosis not present

## 2018-09-03 DIAGNOSIS — I2510 Atherosclerotic heart disease of native coronary artery without angina pectoris: Secondary | ICD-10-CM

## 2018-09-03 DIAGNOSIS — I10 Essential (primary) hypertension: Secondary | ICD-10-CM

## 2018-09-03 DIAGNOSIS — E785 Hyperlipidemia, unspecified: Secondary | ICD-10-CM | POA: Diagnosis not present

## 2018-09-03 NOTE — Patient Instructions (Signed)
Medication Instructions: Your physician recommends that you continue on your current medications as directed. Please refer to the Current Medication list given to you today.   Labwork: None  Procedures/Testing: None  Follow-Up: 4-5 weeks  Any Additional Special Instructions Will Be Listed Below (If Applicable).     If you need a refill on your cardiac medications before your next appointment, please call your pharmacy.  Thank you for choosing [facility] !

## 2018-09-03 NOTE — Progress Notes (Signed)
Cardiology Office Note    Date:  09/03/2018   ID:  Jordan Gross, DOB [birth year], MRN [id number]  PCP:  Ignatius SpeckingVyas, Dhruv B, MD  Cardiologist: Dina RichBranch, Jonathan, MD    Chief Complaint  Patient presents with  . Hospitalization Follow-up    s/p cardiac catheterization    History of Present Illness:    Jordan Gross is a 53 y.o. male with past medical history of HTN and HLD who presents to the office today for hospital follow-up.  He was examined by Dr. [name] in 07/2018 and reported intermittent episodes of chest discomfort over the past several weeks. A Lexiscan Myoview was recommended for further evaluation and he presented for testing on 08/02/2018. During the test, he developed significant EKG changes with associated chest pain and was transported to the ED for further evaluation. Troponin values were elevated to 1.24. Cardiac catheterization was performed that same day which showed 95% proximal LAD stenosis, 75% RI stenosis, and 10% LM stenosis. Underwent DES placement to both the proximal LAD and RI. Was started on DAPT with ASA and Brilinta along with Coreg. Was continued on PTA Amlodipine, HCTZ, Losartan, and Crestor 20mg  daily.   In talking with the patient today, he reports overall doing well since his recent cardiac catheterization. Notes that he had actually been experiencing upper back pain and left arm discomfort prior to stent placement but this has now resolved. He has been using a riding mower along with doing routine chores around the house without any repeat anginal symptoms. Denies any dyspnea on exertion, orthopnea, PND, lower extremity edema, or palpitations. Was previously having intermittent episodes of dizziness but says this improved with dose reduction of HCTZ to 12.5 mg daily.  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 He has not yet started walking for exercise.   He has lost over 20 lbs since his hospitalization.  Reports making multiple dietary changes and was previously consuming 6-10 beers daily, having quit during his hospital stay and has not resumed this.   Reports good compliance with ASA and Brilinta. Denies missing any recent doses. Concerned about the cost of Brilinta as this was more than $100 at the time of his most recent refill.   Past Medical History:  Diagnosis Date  . CAD (coronary artery disease)    a. 07/2018: DES to proximal LAD and DES to RI.   Marland Kitchen. HLD (hyperlipidemia) 08/03/2018  . Hypertension   . Unstable angina (HCC) 08/02/2018    Past Surgical History:  Procedure Laterality Date  . CORONARY/GRAFT ACUTE MI REVASCULARIZATION N/A 08/02/2018   Procedure: CORONARY/GRAFT ACUTE MI REVASCULARIZATION;  Surgeon: Corky CraftsVaranasi, Jayadeep S, MD;  [facility];  Service: Cardiovascular;  Laterality: N/A;  . LEFT HEART CATH AND CORONARY ANGIOGRAPHY N/A 08/02/2018   Procedure: LEFT HEART CATH AND CORONARY ANGIOGRAPHY;  Surgeon: Corky CraftsVaranasi, Jayadeep S, MD;  [facility];  Service: Cardiovascular;  Laterality: N/A;    Current Medications: Outpatient Medications Prior to Visit  Medication Sig Dispense Refill  . acetaminophen (TYLENOL) 325 MG tablet Take 2 tablets (650 mg total) by mouth every 4 (four) hours as needed for headache or mild pain.    Marland Kitchen. amLODipine (NORVASC) 10 MG tablet Take 10 mg by mouth daily.    Marland Kitchen. aspirin EC 81 MG tablet Take 81 mg by mouth daily.    . carvedilol (COREG) 3.125 MG tablet Take 1 tablet (3.125 mg total) by mouth 2 (two)  times daily with a meal. 60 tablet 6  . cetirizine (ZYRTEC) 10 MG tablet Take 10 mg by mouth daily as needed for allergies.    . clonazePAM (KLONOPIN) 1 MG tablet Take 1 mg by mouth at bedtime as needed for anxiety.    . hydrochlorothiazide (HYDRODIURIL) 25 MG tablet Take 25 mg by mouth daily.    Marland Kitchen losartan (COZAAR) 100 MG tablet Take 100 mg by mouth daily.    . nitroGLYCERIN (NITROSTAT) 0.4 MG SL tablet Place 1 tablet  (0.4 mg total) under the tongue every 5 (five) minutes x 3 doses as needed for chest pain. 25 tablet 4  . rosuvastatin (CRESTOR) 20 MG tablet Take 1 tablet (20 mg total) by mouth daily at 6 PM. 30 tablet 6  . ticagrelor (BRILINTA) 90 MG TABS tablet Take 1 tablet (90 mg total) by mouth 2 (two) times daily. 180 tablet 4   No facility-administered medications prior to visit.      Allergies:   Other   Social History   Socioeconomic History  . Marital status: Married    Spouse name: Not on file  . Number of children: Not on file  . Years of education: Not on file  . Highest education level: Not on file  Occupational History    Employer: [employer]  Social Needs  . Financial resource strain: Not on file  . Food insecurity    Worry: Not on file    Inability: Not on file  . Transportation needs    Medical: Not on file    Non-medical: Not on file  Tobacco Use  . Smoking status: Former Research scientist (life sciences)  . Smokeless tobacco: Never Used  Substance and Sexual Activity  . Alcohol use: Yes    Alcohol/week: 70.0 standard drinks    Types: 70 Cans of beer per week    Comment: 10 beers daily in the evening  . Drug use: Never  . Sexual activity: Not on file  Lifestyle  . Physical activity    Days per week: Not on file    Minutes per session: Not on file  . Stress: Not on file  Relationships  . Social Herbalist on phone: Not on file    Gets together: Not on file    Attends religious service: Not on file    Active member of club or organization: Not on file    Attends meetings of clubs or organizations: Not on file    Relationship status: Not on file  Other Topics Concern  . Not on file  Social History Narrative  . Not on file     Family History:  The patient's family history includes Heart attack (age of onset: 66) in his mother.   Review of Systems:   Please see the history of present illness.     General:  No chills, fever, night sweats or weight changes.   Cardiovascular:  No chest pain, dyspnea on exertion, edema, orthopnea, palpitations, paroxysmal nocturnal dyspnea.  Dermatological: No rash, lesions/masses Respiratory: No cough, dyspnea Urologic: No hematuria, dysuria Abdominal:   No nausea, vomiting, diarrhea, bright red blood per rectum, melena, or hematemesis Neurologic:  No visual changes, wkns, changes in mental status. Positive for dizziness.   All other systems reviewed and are otherwise negative except as noted above.   Physical Exam:    VS:  BP 134/90   Pulse (!) 55   Temp (!) 97 F (36.1 C)   Ht 6' (1.829 m)  Wt 239 lb (108.4 kg)   SpO2 98%   BMI 32.41 kg/m    General: Well developed, well nourished male appearing in no acute distress. Head: Normocephalic, atraumatic, sclera non-icteric, no xanthomas, nares are without discharge.  Neck: No carotid bruits. JVD not elevated.  Lungs: Respirations regular and unlabored, without wheezes or rales.  Heart: Regular rate and rhythm. No S3 or S4.  No murmur, no rubs, or gallops appreciated. Abdomen: Soft, non-tender, non-distended with normoactive bowel sounds. No hepatomegaly. No rebound/guarding. No obvious abdominal masses. Msk:  Strength and tone appear normal for age. No joint deformities or effusions. Extremities: No clubbing or cyanosis. No lower extremity edema.  Distal pedal pulses are 2+ bilaterally. Radial cath site stable without ecchymosis or evidence of a hematoma.  Neuro: Alert and oriented X 3. Moves all extremities spontaneously. No focal deficits noted. Psych:  Responds to questions appropriately with a normal affect. Skin: No rashes or lesions noted  Wt Readings from Last 3 Encounters:  09/03/18 239 lb (108.4 kg)  08/03/18 255 lb 12.8 oz (116 kg)  07/31/18 260 lb 3.2 oz (118 kg)     Studies/Labs Reviewed:   EKG:  EKG is ordered today.  The ekg ordered today demonstrates sinus bradycardia, HR 53, with no acute ST abnormalities. TWI along anterior leads  has resolved.   Recent Labs: 08/02/2018: TSH 2.664 08/03/2018: ALT 53; BUN 10; Creatinine, Ser 0.74; Hemoglobin 15.7; Platelets 291; Potassium 3.4; Sodium 137   Lipid Panel    Component Value Date/Time   CHOL 201 (H) 08/03/2018 0211   TRIG 152 (H) 08/03/2018 0211   HDL 33 (L) 08/03/2018 0211   CHOLHDL 6.1 08/03/2018 0211   VLDL 30 08/03/2018 0211   LDLCALC 138 (H) 08/03/2018 0211    Additional studies/ records that were reviewed today include:   Cardiac Catheterization: 08/02/2018  Prox LAD lesion is 95% stenosed.  Ramus lesion is 75% stenosed.  Lat Ramus lesion is 50% stenosed.  Mid LM lesion is 10% stenosed.  A drug-eluting stent was successfully placed using a STENT SYNERGY DES 2.75X20.  Post intervention, there is a 0% residual stenosis.  A drug-eluting stent was successfully placed using a STENT SYNERGY DES 2.5X20.  Post intervention, there is a 0% residual stenosis.  The left ventricular systolic function is normal. Mild mid anterior hypokinesis.  LV end diastolic pressure is normal. LVEDP 10 mm Hg.  The left ventricular ejection fraction is 50-55% by visual estimate.   Continue with aggressive secondary prevention.  He will need DAPT for 12 months.  Consider clopidogrel monotherapy after 12 months.   Intolerant of atorvastatin.  Will start Crestor 20 mg daily.  Lipid lowering therapy will be important going forward.   Echocardiogram: 08/02/2018 IMPRESSIONS    1. The left ventricle has normal systolic function with an ejection fraction of 60-65%. The cavity size was normal. There is mildly increased left ventricular wall thickness. Left ventricular diastolic Doppler parameters are consistent with impaired  relaxation.  2. The right ventricle has normal systolic function. The cavity was normal. There is no increase in right ventricular wall thickness.  3. The aortic root and ascending aorta are normal in size and structure.  4. The interatrial septum was  not assessed.  Assessment:    1. Coronary artery disease involving native coronary artery of native heart without angina pectoris   2. Essential hypertension   3. Hyperlipidemia LDL goal <70   4. History of alcohol use      Plan:  In order of problems listed above:  1. CAD - recent cardiac catheterization showed 95% proximal LAD stenosis and 75% RI stenosis which was treated with DES placement to both the proximal LAD and RI as outlined above.  - he denies any recurrent back pain or arm pain since hospital discharge. He is currently out on short-term disability and I do not have assess to the papers that were recently completed by Dr. [name]. 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 I reviewed with him and recommended increasing his physical activity at home over the next several weeks by walking for 5 minutes intervals several times per day and gradually increasing this to 10 minute intervals then 20 minute intervals. Will arrange for close follow-up in 4-5 weeks. Pending his symptoms and energy level at that time, will readdress returning to work. Hopefully temperatures will be improving by that time as well. He was provided a work note to remain out until his follow-up appointment.  - continue DAPT with ASA and Brilinta along with BB and statin therapy. Was provided with co-pay card for Brilinta as he has Nurse, learning disabilitycommercial insurance. If this remains unaffordable, would need to transition to Plavix.   2. HTN - BP is at 134/90 during today's visit. He reports previously having issues with dizziness but this improved with cutting one of his medications in half. He is unsure if this is Coreg or HCTZ but believes it is HCTZ (meaning he is now taking 12.5mg  daily). Continue Coreg 3.125 mg twice daily, Amlodipine 10 mg daily, and Losartan 100 mg daily. Would not further titrate Coreg given  baseline bradycardia.   3. HLD - FLP during recent admission showed total cholesterol 206, triglycerides 256, HDL 31, and LDL 124.  He was started on Crestor 20 mg daily at that time. Will plan to obtain repeat FLP and LFT's at his next follow-up visit.   4. Alcohol Abuse - he was previously consuming 6-10 beers a day but stopped this after his hospitalization. No evidence of withdrawal per his report. Suspect this has also played a role in his 20+ lb weight loss. Congratulated on this.    Medication Adjustments/Labs and Tests Ordered: Current medicines are reviewed at length with the patient today.  Concerns regarding medicines are outlined above.  Medication changes, Labs and Tests ordered today are listed in the Patient Instructions below. Patient Instructions  Medication Instructions: Your physician recommends that you continue on your current medications as directed. Please refer to the Current Medication list given to you today.  Labwork: None  Procedures/Testing: None  Follow-Up: 4-5 weeks  Any Additional Special Instructions Will Be Listed Below (If Applicable).  If you need a refill on your cardiac medications before your next appointment, please call your pharmacy.  Thank you for choosing [facility] !         Signed, Ellsworth LennoxBrittany M , PA-C  09/03/2018 3:07 PM    [facility] [address] [place], [place] [address] Phone: [phone] Fax: [phone]

## 2018-10-08 ENCOUNTER — Encounter: Payer: Self-pay | Admitting: Student

## 2018-10-08 ENCOUNTER — Other Ambulatory Visit: Payer: Self-pay

## 2018-10-08 ENCOUNTER — Encounter: Payer: Self-pay | Admitting: *Deleted

## 2018-10-08 ENCOUNTER — Ambulatory Visit (INDEPENDENT_AMBULATORY_CARE_PROVIDER_SITE_OTHER): Payer: PRIVATE HEALTH INSURANCE | Admitting: Student

## 2018-10-08 VITALS — BP 136/78 | HR 78 | Temp 97.8°F | Ht 72.0 in | Wt 227.0 lb

## 2018-10-08 DIAGNOSIS — I10 Essential (primary) hypertension: Secondary | ICD-10-CM

## 2018-10-08 DIAGNOSIS — Z87898 Personal history of other specified conditions: Secondary | ICD-10-CM

## 2018-10-08 DIAGNOSIS — E785 Hyperlipidemia, unspecified: Secondary | ICD-10-CM

## 2018-10-08 DIAGNOSIS — I2510 Atherosclerotic heart disease of native coronary artery without angina pectoris: Secondary | ICD-10-CM

## 2018-10-08 NOTE — Progress Notes (Signed)
Cardiology Office Note    Date:  10/08/2018   ID:  Poseidon Schrauth, DOB [birth year], MRN [id number]  PCP:  Ignatius Specking, MD  Cardiologist: Dina Rich, MD    Chief Complaint  Patient presents with  . Follow-up    1 month visit    History of Present Illness:    Jordan Gross is a 53 y.o. male with past medical history of CAD (s/p DES to LAD and DES to RI in 07/2018), HTN, and HLD who presents for 68-month follow-up.   He was last examined by myself in 08/2018 and denied any recurrent chest pain or dyspnea on exertion since his recent cardiac catheterization. Had lost over 20 lbs since his hospitalization and had previously been consuming 6-10 beers prior to stent placement but denied any recent alcohol use. He was working as a Designer, fashion/clothing and there was concern about him returning to this given the hot temperatures and his cardiac status. He was encouraged to increase his physical activity at home and close follow-up was arranged to reassess his candidacy to return to work.  In talking with the patient today, he reports overall doing well since his last office visit. He has been working to remodel homes and has been carrying heavy objects without any recurrent anginal symptoms. He does report occasional dyspnea when walking up inclines but this occurs sporadically. No associated chest pain. He denies any recent orthopnea, PND, or lower extremity edema.  He has continued to lose weight and has lost an additional 12 pounds since his last office visit. He has been following a heart healthy diet and has not consumed alcohol since his hospitalization. Does ask he if can return to having an occasional beer.   Reports good compliance with his current medication regimen and denies missing any doses of ASA or Brilinta.   Past Medical History:  Diagnosis Date  . CAD (coronary artery disease)    a. 07/2018: DES to proximal LAD and DES to RI.   Marland Kitchen HLD (hyperlipidemia) 08/03/2018  . Hypertension    . Unstable angina (HCC) 08/02/2018    Past Surgical History:  Procedure Laterality Date  . CORONARY/GRAFT ACUTE MI REVASCULARIZATION N/A 08/02/2018   Procedure: CORONARY/GRAFT ACUTE MI REVASCULARIZATION;  Surgeon: Corky Crafts, MD;  Location: [facility] INVASIVE CV LAB;  Service: Cardiovascular;  Laterality: N/A;  . LEFT HEART CATH AND CORONARY ANGIOGRAPHY N/A 08/02/2018   Procedure: LEFT HEART CATH AND CORONARY ANGIOGRAPHY;  Surgeon: Corky Crafts, MD;  [facility];  Service: Cardiovascular;  Laterality: N/A;    Current Medications: Outpatient Medications Prior to Visit  Medication Sig Dispense Refill  . acetaminophen (TYLENOL) 325 MG tablet Take 2 tablets (650 mg total) by mouth every 4 (four) hours as needed for headache or mild pain.    Marland Kitchen amLODipine (NORVASC) 10 MG tablet Take 10 mg by mouth daily.    Marland Kitchen aspirin EC 81 MG tablet Take 81 mg by mouth daily.    . carvedilol (COREG) 3.125 MG tablet Take 1 tablet (3.125 mg total) by mouth 2 (two) times daily with a meal. 60 tablet 6  . cetirizine (ZYRTEC) 10 MG tablet Take 10 mg by mouth daily as needed for allergies.    . clonazePAM (KLONOPIN) 1 MG tablet Take 1 mg by mouth at bedtime as needed for anxiety.    . hydrochlorothiazide (HYDRODIURIL) 25 MG tablet Take 25 mg by mouth daily.    Marland Kitchen losartan (COZAAR) 100 MG tablet Take 100 mg  by mouth daily.    . nitroGLYCERIN (NITROSTAT) 0.4 MG SL tablet Place 1 tablet (0.4 mg total) under the tongue every 5 (five) minutes x 3 doses as needed for chest pain. 25 tablet 4  . rosuvastatin (CRESTOR) 20 MG tablet Take 1 tablet (20 mg total) by mouth daily at 6 PM. 30 tablet 6  . ticagrelor (BRILINTA) 90 MG TABS tablet Take 1 tablet (90 mg total) by mouth 2 (two) times daily. 180 tablet 4   No facility-administered medications prior to visit.      Allergies:   Other   Social History   Socioeconomic History  . Marital status: Married    Spouse name: Not on file  . Number of  children: Not on file  . Years of education: Not on file  . Highest education level: Not on file  Occupational History    Employer: [employer]  Social Needs  . Financial resource strain: Not on file  . Food insecurity    Worry: Not on file    Inability: Not on file  . Transportation needs    Medical: Not on file    Non-medical: Not on file  Tobacco Use  . Smoking status: Former Research scientist (life sciences)  . Smokeless tobacco: Never Used  Substance and Sexual Activity  . Alcohol use: Yes    Alcohol/week: 70.0 standard drinks    Types: 70 Cans of beer per week    Comment: 10 beers daily in the evening  . Drug use: Never  . Sexual activity: Not on file  Lifestyle  . Physical activity    Days per week: Not on file    Minutes per session: Not on file  . Stress: Not on file  Relationships  . Social Herbalist on phone: Not on file    Gets together: Not on file    Attends religious service: Not on file    Active member of club or organization: Not on file    Attends meetings of clubs or organizations: Not on file    Relationship status: Not on file  Other Topics Concern  . Not on file  Social History Narrative  . Not on file     Family History:  The patient's family history includes Heart attack (age of onset: 30) in his mother.   Review of Systems:   Please see the history of present illness.     General:  No chills, fever, night sweats or weight changes.  Cardiovascular:  No chest pain, edema, orthopnea, palpitations, paroxysmal nocturnal dyspnea. Positive for intermittent dyspnea on exertion.  Dermatological: No rash, lesions/masses Respiratory: No cough, dyspnea Urologic: No hematuria, dysuria Abdominal:   No nausea, vomiting, diarrhea, bright red blood per rectum, melena, or hematemesis Neurologic:  No visual changes, wkns, changes in mental status.  All other systems reviewed and are otherwise negative except as noted above.   Physical Exam:    VS:  BP 136/78    Pulse 78   Temp 97.8 F (36.6 C)   Ht 6' (1.829 m)   Wt 227 lb (103 kg)   BMI 30.79 kg/m    General: Well developed, well nourished,male appearing in no acute distress. Head: Normocephalic, atraumatic, sclera non-icteric, no xanthomas, nares are without discharge.  Neck: No carotid bruits. JVD not elevated.  Lungs: Respirations regular and unlabored, without wheezes or rales.  Heart: Regular rate and rhythm. No S3 or S4.  No murmur, no rubs, or gallops appreciated. Abdomen: Soft, non-tender, non-distended  with normoactive bowel sounds. No hepatomegaly. No rebound/guarding. No obvious abdominal masses. Msk:  Strength and tone appear normal for age. No joint deformities or effusions. Extremities: No clubbing or cyanosis. No lower extremity edema.  Distal pedal pulses are 2+ bilaterally. Neuro: Alert and oriented X 3. Moves all extremities spontaneously. No focal deficits noted. Psych:  Responds to questions appropriately with a normal affect. Skin: No rashes or lesions noted  Wt Readings from Last 3 Encounters:  10/08/18 227 lb (103 kg)  09/03/18 239 lb (108.4 kg)  08/03/18 255 lb 12.8 oz (116 kg)     Studies/Labs Reviewed:   EKG:  EKG is not ordered today.    Recent Labs: 08/02/2018: TSH 2.664 08/03/2018: ALT 53; BUN 10; Creatinine, Ser 0.74; Hemoglobin 15.7; Platelets 291; Potassium 3.4; Sodium 137   Lipid Panel    Component Value Date/Time   CHOL 201 (H) 08/03/2018 0211   TRIG 152 (H) 08/03/2018 0211   HDL 33 (L) 08/03/2018 0211   CHOLHDL 6.1 08/03/2018 0211   VLDL 30 08/03/2018 0211   LDLCALC 138 (H) 08/03/2018 0211    Additional studies/ records that were reviewed today include:   Cardiac Catheterization: 07/2018  Prox LAD lesion is 95% stenosed.  Ramus lesion is 75% stenosed.  Lat Ramus lesion is 50% stenosed.  Mid LM lesion is 10% stenosed.  A drug-eluting stent was successfully placed using a STENT SYNERGY DES 2.75X20.  Post intervention, there is a 0%  residual stenosis.  A drug-eluting stent was successfully placed using a STENT SYNERGY DES 2.5X20.  Post intervention, there is a 0% residual stenosis.  The left ventricular systolic function is normal. Mild mid anterior hypokinesis.  LV end diastolic pressure is normal. LVEDP 10 mm Hg.  The left ventricular ejection fraction is 50-55% by visual estimate.   Continue with aggressive secondary prevention.  He will need DAPT for 12 months.  Consider clopidogrel monotherapy after 12 months.   Intolerant of atorvastatin.  Will start Crestor 20 mg daily.  Lipid lowering therapy will be important going forward.   Echocardiogram: 07/2018 IMPRESSIONS    1. The left ventricle has normal systolic function with an ejection fraction of 60-65%. The cavity size was normal. There is mildly increased left ventricular wall thickness. Left ventricular diastolic Doppler parameters are consistent with impaired  relaxation.  2. The right ventricle has normal systolic function. The cavity was normal. There is no increase in right ventricular wall thickness.  3. The aortic root and ascending aorta are normal in size and structure.  4. The interatrial septum was not assessed.  Assessment:    1. Coronary artery disease involving native coronary artery of native heart without angina pectoris   2. Essential hypertension   3. Hyperlipidemia LDL goal <70   4. History of alcohol use      Plan:   In order of problems listed above:  1. CAD - he is s/p DES to LAD and DES to RI in 07/2018. He has intermittent dyspnea when walking up inclines but denies any symptoms when performing other activities.  No recent chest pain resembling his prior angina.  - he feels he is now able to return to work. Will provide note today with no restrictions in place. - continue ASA, Brilinta, statin, and BB therapy.   2. HTN - BP is well-controlled at 136/78 during today's visit. - Continue Amlodipine, Coreg, HCTZ, and  Losartan at current dosing.   3. HLD - he is due for repeat FLP/LFT's  but wishes to have these obtained with his routine labs with his PCP next month as he is due for an annual physical. Will ask for FLP and LFT's to be obtained at that time. Goal LDL is less than 70 with known CAD.  - continue Crestor 20mg  daily.   4. History of Alcohol Use - was previously consuming 6-10 beers daily prior to stent placement. He does wish to have an occasional beer and I recommended limiting his intake to 1-2 drinks daily. Reviewed concerns with increased risk of bleeding while on DAPT and cardiac concerns if consuming more than 2 drinks per day.    Medication Adjustments/Labs and Tests Ordered: Current medicines are reviewed at length with the patient today.  Concerns regarding medicines are outlined above.  Medication changes, Labs and Tests ordered today are listed in the Patient Instructions below. Patient Instructions  Medication Instructions:  Your physician recommends that you continue on your current medications as directed. Please refer to the Current Medication list given to you today.  If you need a refill on your cardiac medications before your next appointment, please call your pharmacy.   Lab work: NONE  If you have labs (blood work) drawn today and your tests are completely normal, you will receive your results only by: Marland Kitchen. MyChart Message (if you have MyChart) OR . A paper copy in the mail If you have any lab test that is abnormal or we need to change your treatment, we will call you to review the results.  Testing/Procedures: NONE   Follow-Up: At [facility], you and your health needs are our priority.  As part of our continuing mission to provide you with exceptional heart care, we have created designated Provider Care Teams.  These Care Teams include your primary Cardiologist (physician) and Advanced Practice Providers (APPs -  Physician Assistants and Nurse Practitioners) who all  work together to provide you with the care you need, when you need it. You will need a follow up appointment in 6 months.  Please call our office 2 months in advance to schedule this appointment.  You may see Dina RichBranch, Jonathan, MD or one of the following Advanced Practice Providers on your designated Care Team:   Randall AnBrittany Berlie Persky, PA-C [facility]([place] Office) . Jacolyn ReedyMichele Lenze, PA-C [facility]([place] Office)  Any Other Special Instructions Will Be Listed Below (If Applicable). Thank you for choosing [facility]!     Signed, Ellsworth LennoxBrittany M Quandra Fedorchak, PA-C  10/08/2018 7:24 PM    [facility] [address] [place], [place] [address] Phone: [phone] Fax: [phone]) [phone]

## 2018-10-08 NOTE — Patient Instructions (Addendum)
Medication Instructions:  Your physician recommends that you continue on your current medications as directed. Please refer to the Current Medication list given to you today.  If you need a refill on your cardiac medications before your next appointment, please call your pharmacy.   Lab work: NONE  If you have labs (blood work) drawn today and your tests are completely normal, you will receive your results only by: Marland Kitchen MyChart Message (if you have MyChart) OR . A paper copy in the mail If you have any lab test that is abnormal or we need to change your treatment, we will call you to review the results.  Testing/Procedures: NONE   Follow-Up: At [facility], you and your health needs are our priority.  As part of our continuing mission to provide you with exceptional heart care, we have created designated Provider Care Teams.  These Care Teams include your primary Cardiologist (physician) and Advanced Practice Providers (APPs -  Physician Assistants and Nurse Practitioners) who all work together to provide you with the care you need, when you need it. You will need a follow up appointment in 6 months.  Please call our office 2 months in advance to schedule this appointment.  You may see Carlyle Dolly, MD or one of the following Advanced Practice Providers on your designated Care Team:   Bernerd Pho, PA-C [facility]) . Ermalinda Barrios, PA-C ([place])  Any Other Special Instructions Will Be Listed Below (If Applicable). Thank you for choosing [facility]!     Heart-Healthy Eating Plan Heart-healthy meal planning includes:  Eating less unhealthy fats.  Eating more healthy fats.  Making other changes in your diet. Talk with your doctor or a diet specialist (dietitian) to create an eating plan that is right for you. What are tips for following this plan? Cooking Avoid frying your food. Try to bake, boil, grill, or broil it instead. You can also reduce  fat by:  Removing the skin from poultry.  Removing all visible fats from meats.  Steaming vegetables in water or broth. Meal planning   At meals, divide your plate into four equal parts: ? Fill one-half of your plate with vegetables and green salads. ? Fill one-fourth of your plate with whole grains. ? Fill one-fourth of your plate with lean protein foods.  Eat 4-5 servings of vegetables per day. A serving of vegetables is: ? 1 cup of raw or cooked vegetables. ? 2 cups of raw leafy greens.  Eat 4-5 servings of fruit per day. A serving of fruit is: ? 1 medium whole fruit. ?  cup of dried fruit. ?  cup of fresh, frozen, or canned fruit. ?  cup of 100% fruit juice.  Eat more foods that have soluble fiber. These are apples, broccoli, carrots, beans, peas, and barley. Try to get 20-30 g of fiber per day.  Eat 4-5 servings of nuts, legumes, and seeds per week: ? 1 serving of dried beans or legumes equals  cup after being cooked. ? 1 serving of nuts is  cup. ? 1 serving of seeds equals 1 tablespoon. General information  Eat more home-cooked food. Eat less restaurant, buffet, and fast food.  Limit or avoid alcohol.  Limit foods that are high in starch and sugar.  Avoid fried foods.  Lose weight if you are overweight.  Keep track of how much salt (sodium) you eat. This is important if you have high blood pressure. Ask your doctor to tell you more about this.  Try to add vegetarian meals each week. Fats  Choose healthy fats. These include olive oil and canola oil, flaxseeds, walnuts, almonds, and seeds.  Eat more omega-3 fats. These include salmon, mackerel, sardines, tuna, flaxseed oil, and ground flaxseeds. Try to eat fish at least 2 times each week.  Check food labels. Avoid foods with trans fats or high amounts of saturated fat.  Limit saturated fats. ? These are often found in animal products, such as meats, butter, and cream. ? These are also found in plant  foods, such as palm oil, palm kernel oil, and coconut oil.  Avoid foods with partially hydrogenated oils in them. These have trans fats. Examples are stick margarine, some tub margarines, cookies, crackers, and other baked goods. What foods can I eat? Fruits All fresh, canned (in natural juice), or frozen fruits. Vegetables Fresh or frozen vegetables (raw, steamed, roasted, or grilled). Green salads. Grains Most grains. Choose whole wheat and whole grains most of the time. Rice and pasta, including brown rice and pastas made with whole wheat. Meats and other proteins Lean, well-trimmed beef, veal, pork, and lamb. Chicken and [place] without skin. All fish and shellfish. Wild duck, rabbit, pheasant, and venison. Egg whites or low-cholesterol egg substitutes. Dried beans, peas, lentils, and tofu. Seeds and most nuts. Dairy Low-fat or nonfat cheeses, including ricotta and mozzarella. Skim or 1% milk that is liquid, powdered, or evaporated. Buttermilk that is made with low-fat milk. Nonfat or low-fat yogurt. Fats and oils Non-hydrogenated (trans-free) margarines. Vegetable oils, including soybean, sesame, sunflower, olive, peanut, safflower, corn, canola, and cottonseed. Salad dressings or mayonnaise made with a vegetable oil. Beverages Mineral water. Coffee and tea. Diet carbonated beverages. Sweets and desserts Sherbet, gelatin, and fruit ice. Small amounts of dark chocolate. Limit all sweets and desserts. Seasonings and condiments All seasonings and condiments. The items listed above may not be a complete list of foods and drinks you can eat. Contact a dietitian for more options. What foods should I avoid? Fruits Canned fruit in heavy syrup. Fruit in cream or butter sauce. Fried fruit. Limit coconut. Vegetables Vegetables cooked in cheese, cream, or butter sauce. Fried vegetables. Grains Breads that are made with saturated or trans fats, oils, or whole milk. Croissants. Sweet rolls.  Donuts. High-fat crackers, such as cheese crackers. Meats and other proteins Fatty meats, such as hot dogs, ribs, sausage, bacon, rib-eye roast or steak. High-fat deli meats, such as salami and bologna. Caviar. Domestic duck and goose. Organ meats, such as liver. Dairy Cream, sour cream, cream cheese, and creamed cottage cheese. Whole-milk cheeses. Whole or 2% milk that is liquid, evaporated, or condensed. Whole buttermilk. Cream sauce or high-fat cheese sauce. Yogurt that is made from whole milk. Fats and oils Meat fat, or shortening. Cocoa butter, hydrogenated oils, palm oil, coconut oil, palm kernel oil. Solid fats and shortenings, including bacon fat, salt pork, lard, and butter. Nondairy cream substitutes. Salad dressings with cheese or sour cream. Beverages Regular sodas and juice drinks with added sugar. Sweets and desserts Frosting. Pudding. Cookies. Cakes. Pies. Milk chocolate or white chocolate. Buttered syrups. Full-fat ice cream or ice cream drinks. The items listed above may not be a complete list of foods and drinks to avoid. Contact a dietitian for more information. Summary  Heart-healthy meal planning includes eating less unhealthy fats, eating more healthy fats, and making other changes in your diet.  Eat a balanced diet. This includes fruits and vegetables, low-fat or nonfat dairy, lean protein, nuts and legumes, whole  grains, and heart-healthy oils and fats. This information is not intended to replace advice given to you by your health care provider. Make sure you discuss any questions you have with your health care provider. Document Released: 08/08/2011 Document Revised: 04/12/2017 Document Reviewed: 03/16/2017 Elsevier Patient Education  2020 [employer].

## 2018-11-22 ENCOUNTER — Encounter: Payer: Self-pay | Admitting: Internal Medicine

## 2018-11-25 ENCOUNTER — Telehealth: Payer: Self-pay | Admitting: Student

## 2018-11-25 NOTE — Telephone Encounter (Signed)

## 2018-12-02 ENCOUNTER — Encounter: Payer: Self-pay | Admitting: Internal Medicine

## 2018-12-02 NOTE — Progress Notes (Signed)
Virtual Visit via Telephone Note   This visit type was conducted due to national recommendations for restrictions regarding the COVID-19 Pandemic (e.g. social distancing) in an effort to limit this patient's exposure and mitigate transmission in our community.  Due to his co-morbid illnesses, this patient is at least at moderate risk for complications without adequate follow up.  This format is felt to be most appropriate for this patient at this time.  The patient did not have access to video technology/had technical difficulties with video requiring transitioning to audio format only (telephone).  All issues noted in this document were discussed and addressed.  No physical exam could be performed with this format.  Please refer to the patient's chart for his  consent to telehealth for [facility].   Date:  12/02/2018   ID:  Jordan Gross, DOB Apr 26, 1965, MRN [id number]  Patient Location: Home Provider Location: Office  PCP:  Ignatius Specking, MD  Cardiologist:  Dina Rich, MD  Electrophysiologist:  None   Evaluation Performed:  Follow-Up Visit  Chief Complaint: Chest Pain  History of Present Illness:    Jordan Gross is a 53 y.o. male with past medical history of CAD (s/p DES to LAD and DES to RI in 07/2018), HTN, and HLD who presents for a telehealth visit to discuss recent lab work.   He was last examined by myself in 09/2018 and reported overall doing well from a cardiac perspective, denying any recent chest pain or dyspnea on exertion. He had continued to lose weight since consuming a heart healthy diet and no longer consuming beer. He was continued on his current medication regimen at that time including ASA, Brilinta, Amlodipine, Coreg, HCTZ, Losartan and Crestor 20mg  daily.   In talking with the patient today, he reports having intermittent episodes of chest discomfort and shoulder pain since returning to work approximately 2 months ago. Not as intense as when he required  stent placement but is occurring multiple times per week. 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 He has not had to utilize SL NTG. Reports associated dyspnea when this occurs. No nausea, vomiting, diaphoresis or presyncope. Denies any recent orthopnea, PND, lower extremity edema or palpitations.  The patient does not have symptoms concerning for COVID-19 infection (fever, chills, cough, or new shortness of breath).    Past Medical History:  Diagnosis Date  . CAD (coronary artery disease)    a. 07/2018: DES to proximal LAD and DES to RI.   Marland Kitchen HLD (hyperlipidemia) 08/03/2018  . Hypertension   . Unstable angina (HCC) 08/02/2018   Past Surgical History:  Procedure Laterality Date  . CORONARY/GRAFT ACUTE MI REVASCULARIZATION N/A 08/02/2018   Procedure: CORONARY/GRAFT ACUTE MI REVASCULARIZATION;  Surgeon: Corky Crafts, MD;  Location: [facility] INVASIVE CV LAB;  Service: Cardiovascular;  Laterality: N/A;  . LEFT HEART CATH AND CORONARY ANGIOGRAPHY N/A 08/02/2018   Procedure: LEFT HEART CATH AND CORONARY ANGIOGRAPHY;  Surgeon: Corky Crafts, MD;  Location: [facility] INVASIVE CV LAB;  Service: Cardiovascular;  Laterality: N/A;     No outpatient medications have been marked as taking for the 12/03/18 encounter (Appointment) with Ellsworth Lennox, PA-C.     Allergies:   Other   Social History   Tobacco Use  . Smoking status: Former Games developer  . Smokeless tobacco:  Never Used  Substance Use Topics  . Alcohol use: Yes    Alcohol/week: 70.0 standard drinks    Types: 70 Cans of beer per week    Comment: 10 beers daily in the evening  . Drug use: Never     Family Hx: The patient's family history includes Heart attack (age of onset: 40) in his mother.  ROS:   Please see the history of present illness.     All other systems reviewed and are  negative.   Prior CV studies:   The following studies were reviewed today:  Cardiac Catheterization: 07/2018  Prox LAD lesion is 95% stenosed.  Ramus lesion is 75% stenosed.  Lat Ramus lesion is 50% stenosed.  Mid LM lesion is 10% stenosed.  A drug-eluting stent was successfully placed using a STENT SYNERGY DES 2.75X20.  Post intervention, there is a 0% residual stenosis.  A drug-eluting stent was successfully placed using a STENT SYNERGY DES 2.5X20.  Post intervention, there is a 0% residual stenosis.  The left ventricular systolic function is normal. Mild mid anterior hypokinesis.  LV end diastolic pressure is normal. LVEDP 10 mm Hg.  The left ventricular ejection fraction is 50-55% by visual estimate.   Continue with aggressive secondary prevention.  He will need DAPT for 12 months.  Consider clopidogrel monotherapy after 12 months.   Intolerant of atorvastatin.  Will start Crestor 20 mg daily.  Lipid lowering therapy will be important going forward.   Echocardiogram: 07/2018 1. The left ventricle has normal systolic function with an ejection fraction of 60-65%. The cavity size was normal. There is mildly increased left ventricular wall thickness. Left ventricular diastolic Doppler parameters are consistent with impaired  relaxation.  2. The right ventricle has normal systolic function. The cavity was normal. There is no increase in right ventricular wall thickness.  3. The aortic root and ascending aorta are normal in size and structure.  4. The interatrial septum was not assessed.    Labs/Other Tests and Data Reviewed:    EKG:  No ECG reviewed.  Recent Labs: 08/02/2018: TSH 2.664 08/03/2018: ALT 53; BUN 10; Creatinine, Ser 0.74; Hemoglobin 15.7; Platelets 291; Potassium 3.4; Sodium 137   Recent Lipid Panel Lab Results  Component Value Date/Time   CHOL 201 (H) 08/03/2018 02:11 AM   TRIG 152 (H) 08/03/2018 02:11 AM   HDL 33 (L) 08/03/2018 02:11 AM   CHOLHDL  6.1 08/03/2018 02:11 AM   LDLCALC 138 (H) 08/03/2018 02:11 AM    Wt Readings from Last 3 Encounters:  10/08/18 227 lb (103 kg)  09/03/18 239 lb (108.4 kg)  08/03/18 255 lb 12.8 oz (116 kg)     Objective:    Vital Signs:  There were no vitals taken for this visit.   General: Pleasant male sounding in NAD Psych: Normal affect. Neuro: Alert and oriented X 3.   Lungs:  Resp regular and unlabored while talking on the phone.    ASSESSMENT & PLAN:    1. CAD - he is s/p DES to LAD and DES to RI in 07/2018.  He does report episodes of chest discomfort for the past several months and says he was experiencing similar episodes after stent placement but has noticed this more frequently since returning to work. Able to perform certain activities without anginal symptoms. His episodes typically resolve within a few seconds with rest. He does have SL NTG on hand if needed.  - will arrange for nurse visit later this week for EKG.  If no significant changes, would plan to start Imdur 30mg  daily and this was reviewed with the patient today. If symptoms persist despite medical therapy, would consider a repeat stress test. - continue ASA, Brilinta, BB and statin therapy.   2. HTN - BP was well controlled at 128/82 on most recent check. Continue current medication regimen with Amlodipine 10mg  daily, Coreg 3.125mg  BID, Losartan 100mg  daily and HCTZ 25mg  daily.   3. HLD - Repeat labs were obtained by his PCP last month and showed total cholesterol 78, triglycerides 102, HDL 26 and LDL 32. LFT's WNL. Was congratulated on his results as LDL was previously elevated to 124 in 07/2018.  Reviewed ways to increase physical activity in an effort to improve HDL. Continue Crestor 20mg  daily.    COVID-19 Education: The signs and symptoms of COVID-19 were discussed with the patient and how to seek care for testing (follow up with PCP or arrange E-visit).  The importance of social distancing was discussed today.   Time:   Today, I have spent 22 minutes with the patient with telehealth technology discussing the above problems.     Medication Adjustments/Labs and Tests Ordered: Current medicines are reviewed at length with the patient today.  Concerns regarding medicines are outlined above.   Tests Ordered: No orders of the defined types were placed in this encounter.   Medication Changes: No orders of the defined types were placed in this encounter.   Follow Up: Nurse Visit for EKG this week;  In Person in 3 month(s)  Signed, , PA-C  12/02/2018 3:25 PM    [facility]

## 2018-12-03 ENCOUNTER — Encounter: Payer: Self-pay | Admitting: Student

## 2018-12-03 ENCOUNTER — Encounter: Payer: Self-pay | Admitting: *Deleted

## 2018-12-03 ENCOUNTER — Telehealth (INDEPENDENT_AMBULATORY_CARE_PROVIDER_SITE_OTHER): Payer: No Typology Code available for payment source | Admitting: Student

## 2018-12-03 VITALS — BP 128/82 | HR 55 | Ht 72.0 in | Wt 210.0 lb

## 2018-12-03 DIAGNOSIS — I10 Essential (primary) hypertension: Secondary | ICD-10-CM

## 2018-12-03 DIAGNOSIS — I25118 Atherosclerotic heart disease of native coronary artery with other forms of angina pectoris: Secondary | ICD-10-CM | POA: Diagnosis not present

## 2018-12-03 DIAGNOSIS — E785 Hyperlipidemia, unspecified: Secondary | ICD-10-CM

## 2018-12-03 NOTE — Patient Instructions (Signed)
Medication Instructions:  Your physician recommends that you continue on your current medications as directed. Please refer to the Current Medication list given to you today.   Labwork: NONE  Testing/Procedures: NONE  Follow-Up: Your physician recommends that you schedule a follow-up appointment in: Friday for Nurse Visit  Your physician recommends that you schedule a follow-up appointment in: 3 Months with Dr. Harl Bowie    Any Other Special Instructions Will Be Listed Below (If Applicable).     If you need a refill on your cardiac medications before your next appointment, please call your pharmacy. Thank you for choosing [facility]!

## 2018-12-05 ENCOUNTER — Encounter: Payer: Self-pay | Admitting: Nurse Practitioner

## 2018-12-06 ENCOUNTER — Ambulatory Visit: Payer: PRIVATE HEALTH INSURANCE

## 2018-12-10 ENCOUNTER — Ambulatory Visit (INDEPENDENT_AMBULATORY_CARE_PROVIDER_SITE_OTHER): Payer: No Typology Code available for payment source

## 2018-12-10 ENCOUNTER — Other Ambulatory Visit: Payer: Self-pay

## 2018-12-10 VITALS — BP 136/74 | HR 52 | Wt 227.0 lb

## 2018-12-10 DIAGNOSIS — I25118 Atherosclerotic heart disease of native coronary artery with other forms of angina pectoris: Secondary | ICD-10-CM | POA: Diagnosis not present

## 2018-12-10 MED ORDER — ISOSORBIDE MONONITRATE ER 30 MG PO TB24: 30.0000 mg | ORAL_TABLET | Freq: Every day | ORAL | 3 refills | Status: DC

## 2018-12-10 NOTE — Progress Notes (Signed)
Pt came in for a nurse visit with EKG per Bernerd Pho, PA-C note. Pt is feeling fine today. No issues.

## 2018-12-10 NOTE — Patient Instructions (Signed)
Medication Instructions:  Start imdur 30 mg daily  Labwork: none  Testing/Procedures: none  Follow-Up: Your physician recommends that you schedule a follow-up appointment in: keep regular scheduled appointment    Any Other Special Instructions Will Be Listed Below (If Applicable).     If you need a refill on your cardiac medications before your next appointment, please call your pharmacy.

## 2018-12-10 NOTE — Progress Notes (Signed)
    EKG shows sinus bradycardia with no acute ST changes when compared to prior tracings. Will plan to start Imdur 30mg  daily as discussed at the time of his recent visit.   Signed, Erma Heritage, PA-C 12/10/2018, 10:42 AM Pager: [phone]

## 2018-12-23 ENCOUNTER — Ambulatory Visit: Payer: PRIVATE HEALTH INSURANCE | Admitting: Gastroenterology

## 2018-12-25 NOTE — Progress Notes (Signed)
Referring Provider: Glenda Chroman, MD Primary Care Physician:  Glenda Chroman, MD Primary Gastroenterologist:  Dr. Gala Romney  Chief Complaint  Patient presents with  . Colonoscopy    never had tcs    HPI:   Jordan Gross is a 53 y.o. male presenting today at the request of Vyas, Dhruv B, MD for consult colonoscopy.   Past medical history significant for HTN, HLD, and CAD s/p DES to proximal LAD and DES to RI in June 2020.  Plans to continue DAPT x12 months.  Last seen by cardiology on 12/03/2018 reporting return of intermittent chest discomfort and shoulder pain since turning toward 2 months ago.  Lasting for a few seconds and resolves once he rests for a few minutes.  No use of SL NTG.  Associated dyspnea.  No other significant symptoms.  Recommended repeating EKG and starting Imdur 30 mg daily if no significant changes.  If symptoms were to continue to persist, would consider repeat stress test.  Plan to follow-up in 3 months.  Reviewed referral note from PCP from September 2020. Patient was doing well at that time. Referred for colonoscopy due to age for screening. Labs on 11/15/18 with UA, CBC, CMP, TSH, and PSA normal. Lipid panel with low HLD. See labs below.   Today he states he is doing. Continues with occasional chest pain at work, only when he is really active. Took imdur for a couple days and this made him dizzy, so he isn't taking anymore. Some SOB with exertion. None at rest. He is a Theme park manager. No heart palpitations.   No abdominal pain. BMs daily. No constipation or diarrhea. No blood in the stool or black stools. No GERD, heartburn, or indigestion. No nausea or vomiting. No dysphagia. Has lost about 55 lbs since June. This was intentional. 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 No alcohol since June. Also cut out red meet and pork.   No family history of colon cancer or colon polyps.    Past Medical History:  Diagnosis Date  . CAD (coronary artery disease)    a.  07/2018: DES to proximal LAD and DES to RI.   Marland Kitchen HLD (hyperlipidemia) 08/03/2018  . Hypertension   . Unstable angina ([place]) 08/02/2018    Past Surgical History:  Procedure Laterality Date  . CORONARY/GRAFT ACUTE MI REVASCULARIZATION N/A 08/02/2018   Procedure: CORONARY/GRAFT ACUTE MI REVASCULARIZATION;  Surgeon: Jettie Booze, MD;  Location: [place] CV LAB;  Service: Cardiovascular;  Laterality: N/A;  . LEFT HEART CATH AND CORONARY ANGIOGRAPHY N/A 08/02/2018   Procedure: LEFT HEART CATH AND CORONARY ANGIOGRAPHY;  Surgeon: Jettie Booze, MD;  Location: [place] CV LAB;  Service: Cardiovascular;  Laterality: N/A;    Current Outpatient Medications  Medication Sig Dispense Refill  . acetaminophen (TYLENOL) 325 MG tablet Take 2 tablets (650 mg total) by mouth every 4 (four) hours as needed for headache or mild pain.    Marland Kitchen amLODipine (NORVASC) 10 MG tablet Take 10 mg by mouth daily.    Marland Kitchen aspirin EC 81 MG tablet Take 81 mg by mouth daily.    . carvedilol (COREG) 3.125 MG tablet Take 1 tablet (3.125 mg total) by mouth 2 (two) times daily with a meal. 60 tablet 6  . cetirizine (ZYRTEC) 10 MG tablet Take 10 mg by mouth daily as needed for allergies.    . clonazePAM (KLONOPIN) 1 MG tablet Take 1 mg by mouth at bedtime as needed for anxiety.    Marland Kitchen  hydrochlorothiazide (HYDRODIURIL) 25 MG tablet Take 25 mg by mouth daily.    Marland Kitchen losartan (COZAAR) 100 MG tablet Take 100 mg by mouth daily.    . nitroGLYCERIN (NITROSTAT) 0.4 MG SL tablet Place 1 tablet (0.4 mg total) under the tongue every 5 (five) minutes x 3 doses as needed for chest pain. 25 tablet 4  . rosuvastatin (CRESTOR) 20 MG tablet Take 1 tablet (20 mg total) by mouth daily at 6 PM. 30 tablet 6  . ticagrelor (BRILINTA) 90 MG TABS tablet Take 1 tablet (90 mg total) by mouth 2 (two) times daily. 180 tablet 4   No current facility-administered medications for this visit.     Allergies as of 12/26/2018 - Review Complete 12/26/2018   Allergen Reaction Noted  . Other  07/23/2018    Family History  Problem Relation Age of Onset  . Heart attack Mother 50  . Colon cancer Neg Hx   . Colon polyps Neg Hx     Social History   Socioeconomic History  . Marital status: Married    Spouse name: Not on file  . Number of children: Not on file  . Years of education: Not on file  . Highest education level: Not on file  Occupational History    Employer: [employer]  Social Needs  . Financial resource strain: Not on file  . Food insecurity    Worry: Not on file    Inability: Not on file  . Transportation needs    Medical: Not on file    Non-medical: Not on file  Tobacco Use  . Smoking status: Former Research scientist (life sciences)  . Smokeless tobacco: Never Used  Substance and Sexual Activity  . Alcohol use: Not Currently    Alcohol/week: 70.0 standard drinks    Types: 70 Cans of beer per week    Comment: Used to drink 12-15 beer a day; None since June 2020  . Drug use: Never  . Sexual activity: Not on file  Lifestyle  . Physical activity    Days per week: Not on file    Minutes per session: Not on file  . Stress: Not on file  Relationships  . Social Herbalist on phone: Not on file    Gets together: Not on file    Attends religious service: Not on file    Active member of club or organization: Not on file    Attends meetings of clubs or organizations: Not on file    Relationship status: Not on file  . Intimate partner violence    Fear of current or ex partner: Not on file    Emotionally abused: Not on file    Physically abused: Not on file    Forced sexual activity: Not on file  Other Topics Concern  . Not on file  Social History Narrative  . Not on file    Review of Systems: Gen: Denies any fever, chills HEENT: Denies nasal congestion or sore throat CV: See HPI  Resp: No cough.  GI: See HPI GU : Denies urinary burning, urinary frequency, urinary hesitancy MS: Denies joint pain Derm: Denies rash  Psych: Denies depression, anxiety Heme: Denies bruising, bleeding  Physical Exam: BP 127/83   Pulse (!) 53   Temp (!) 96.6 F (35.9 C) (Temporal)   Ht 6' (1.829 m)   Wt 216 lb (98 kg)   BMI 29.29 kg/m  General:   Alert and oriented. Pleasant and cooperative. Well-nourished and well-developed.  Head:  Normocephalic and atraumatic. Eyes:  Without icterus, sclera clear and conjunctiva pink.  Ears:  Normal auditory acuity. Nose:  No deformity, discharge,  or lesions. Lungs:  Clear to auscultation bilaterally. No wheezes, rales, or rhonchi. No distress.  Heart:  S1, S2 present without murmurs appreciated.  Abdomen:  +BS, soft, non-tender and non-distended. No HSM noted. No guarding or rebound. No masses appreciated.  Rectal:  Deferred  Msk:  Symmetrical without gross deformities. Normal posture. Extremities:  Without edema. Neurologic:  Alert and  oriented x4;  grossly normal neurologically. Skin:  Intact without significant lesions or rashes. Psych: Normal mood and affect.  Labs: 11/15/2018: CBC: WBC 7.1, hemoglobin 13.9, MCV 88, MCH 30.3, MCHC 34.4, platelets 296 TSH 2.79 Lipid panel: Total cholesterol 78, triglycerides 102, HDL 26, VLDL 20, LDL 32 CMP: Creatinine 0.83, sodium 139, potassium 3.9, chloride 103, calcium 10.0, albumin 4.7, bilirubin 0.7, alk phos 100, AST 17, ALT 19 PSA 1.0

## 2018-12-26 ENCOUNTER — Encounter: Payer: Self-pay | Admitting: Gastroenterology

## 2018-12-26 ENCOUNTER — Other Ambulatory Visit: Payer: Self-pay

## 2018-12-26 ENCOUNTER — Encounter: Payer: Self-pay | Admitting: Internal Medicine

## 2018-12-26 ENCOUNTER — Ambulatory Visit (INDEPENDENT_AMBULATORY_CARE_PROVIDER_SITE_OTHER): Payer: No Typology Code available for payment source | Admitting: Gastroenterology

## 2018-12-26 DIAGNOSIS — Z1211 Encounter for screening for malignant neoplasm of colon: Secondary | ICD-10-CM | POA: Diagnosis not present

## 2018-12-26 NOTE — Assessment & Plan Note (Signed)
53 y.o. male presenting today to schedule his first ever screening colonoscopy. Past medical history significant for HTN, HLD, and CAD s/p DES to proximal LAD and DES to RI in June 2020.  Plans to continue DAPT (Brilinta and aspirin) x12 months. He continues with mild intermittent chest pain with exertion and was recently started on Imdur in October but stopped taking due to dizziness. He is without any upper or lower GI symptoms. No alarm symptoms. No family history of colon cancer or colon polyps.   At this point, as this would just be a screening TCS, I recommend patient follow-up with cardiology and complete his 12 months of uninterrupted DAPT prior to scheduling his colonoscopy. Although Brilinta doesn't necessarily have to be stopped for TCS, I would not want a complication to occur requiring Brilinta to be stopped and risk occluding his stents. We will plan to see him back in July 2021 to schedule TCS. He should have completed DAPT at that time. He was advised to call us if he develops any new GI symptoms in the meantime.  I also advised he call cardiology to discuss the side effects of Imdur as he has discontinued this medication and continues with intermittent chest pain.

## 2018-12-26 NOTE — Patient Instructions (Addendum)
Please call cardiology and update them on the side effects that Imdur.  They may need to adjust the dose or start you on something different.  As you are doing well from a GI standpoint, we will plan to see you back in July 2021 to schedule your colonoscopy at that time.  Hopefully you will be off of Brilinta at that time.  If you have any questions or concerns in the meantime or new symptoms arise, please do not hesitate to call us!  Aliene Altes, PA-C [facility]

## 2019-03-11 ENCOUNTER — Encounter: Payer: Self-pay | Admitting: Cardiology

## 2019-03-11 ENCOUNTER — Ambulatory Visit (INDEPENDENT_AMBULATORY_CARE_PROVIDER_SITE_OTHER): Payer: No Typology Code available for payment source | Admitting: Cardiology

## 2019-03-11 VITALS — BP 109/67 | HR 54 | Temp 98.9°F | Ht 72.0 in | Wt 214.0 lb

## 2019-03-11 DIAGNOSIS — I25118 Atherosclerotic heart disease of native coronary artery with other forms of angina pectoris: Secondary | ICD-10-CM | POA: Diagnosis not present

## 2019-03-11 DIAGNOSIS — I10 Essential (primary) hypertension: Secondary | ICD-10-CM | POA: Diagnosis not present

## 2019-03-11 NOTE — Patient Instructions (Signed)
Medication Instructions:  HOLD COREG FOR 1 WEEK, THEN PLEASE CALL TO UPDATE ON DIZZINESS & CHEST PAIN   Labwork: NONE  Testing/Procedures: NONE  Follow-Up: Your physician recommends that you schedule a follow-up appointment in: 3 WEEK VIRTUAL VISIT   Any Other Special Instructions Will Be Listed Below (If Applicable).     If you need a refill on your cardiac medications before your next appointment, please call your pharmacy.

## 2019-03-11 NOTE — Progress Notes (Signed)
Clinical Summary Jordan Gross is a 54 y.o.male seen today for follow up of the following medical problems.    1. CAD - 07/2018 cath: received DES to prox LAD and ramus - 07/2018 echo LVEF 60-65%   - some chest pains at times. 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 Dull pain midchest, 6/10 in severity. Can have some SOB or dizziness. Resolves after about 5 minutes.  - not positional - stable over the last several months as far as severity, frequency,  - dizziness with episodes.   - dizziness on imdur. Stopped taking.    2. Hyperlipidemia - 10/2018 TC 78 TG 102 HDL 26 LDL 32 - he reports pcp lowered crestor to 10mg  daily due to low LDL  Past Medical History:  Diagnosis Date  . CAD (coronary artery disease)    a. 07/2018: DES to proximal LAD and DES to RI.   08/2018 HLD (hyperlipidemia) 08/03/2018  . Hypertension   . Unstable angina (HCC) 08/02/2018     Allergies  Allergen Reactions  . Other     Some type of cholesterol medication, unknown what type that caused muscle cramps in his legs lipitor     Current Outpatient Medications  Medication Sig Dispense Refill  . acetaminophen (TYLENOL) 325 MG tablet Take 2 tablets (650 mg total) by mouth every 4 (four) hours as needed for headache or mild pain.    10/02/2018 amLODipine (NORVASC) 10 MG tablet Take 10 mg by mouth daily.    Marland Kitchen aspirin EC 81 MG tablet Take 81 mg by mouth daily.    . carvedilol (COREG) 3.125 MG tablet Take 1 tablet (3.125 mg total) by mouth 2 (two) times daily with a meal. 60 tablet 6  . cetirizine (ZYRTEC) 10 MG tablet Take 10 mg by mouth daily as needed for allergies.    . clonazePAM (KLONOPIN) 1 MG tablet Take 1 mg by mouth at bedtime as needed for anxiety.    . hydrochlorothiazide (HYDRODIURIL) 25 MG tablet Take 25 mg by mouth daily.    Marland Kitchen losartan (COZAAR) 100 MG tablet Take 100 mg by mouth daily.    . nitroGLYCERIN (NITROSTAT) 0.4 MG SL tablet Place 1 tablet  (0.4 mg total) under the tongue every 5 (five) minutes x 3 doses as needed for chest pain. 25 tablet 4  . rosuvastatin (CRESTOR) 10 MG tablet Take 10 mg by mouth daily.    . ticagrelor (BRILINTA) 90 MG TABS tablet Take 1 tablet (90 mg total) by mouth 2 (two) times daily. 180 tablet 4   No current facility-administered medications for this visit.     Past Surgical History:  Procedure Laterality Date  . CORONARY/GRAFT ACUTE MI REVASCULARIZATION N/A 08/02/2018   Procedure: CORONARY/GRAFT ACUTE MI REVASCULARIZATION;  Surgeon: 10/02/2018, MD;  Location: [facility] INVASIVE CV LAB;  Service: Cardiovascular;  Laterality: N/A;  . LEFT HEART CATH AND CORONARY ANGIOGRAPHY N/A 08/02/2018   Procedure: LEFT HEART CATH AND CORONARY ANGIOGRAPHY;  Surgeon: 10/02/2018, MD;  Location: [facility] INVASIVE CV LAB;  Service: Cardiovascular;  Laterality: N/A;     Allergies  Allergen Reactions  . Other     Some type of cholesterol medication, unknown what type that caused muscle cramps in his legs lipitor      Family History  Problem Relation Age of Onset  . Heart attack Mother 63  . CAD Father   . CAD Sister   .  Colon cancer Neg Hx   . Colon polyps Neg Hx      Social History Mr. Bob reports that he has quit smoking. He has never used smokeless tobacco. Mr. Sherk reports previous alcohol use of about 70.0 standard drinks of alcohol per week.   Review of Systems CONSTITUTIONAL: No weight loss, fever, chills, weakness or fatigue.  HEENT: Eyes: No visual loss, blurred vision, double vision or yellow sclerae.No hearing loss, sneezing, congestion, runny nose or sore throat.  SKIN: No rash or itching.  CARDIOVASCULAR: per hpi RESPIRATORY: No shortness of breath, cough or sputum.  GASTROINTESTINAL: No anorexia, nausea, vomiting or diarrhea. No abdominal pain or blood.  GENITOURINARY: No burning on urination, no polyuria NEUROLOGICAL: No headache, dizziness, syncope, paralysis, ataxia,  numbness or tingling in the extremities. No change in bowel or bladder control.  MUSCULOSKELETAL: No muscle, back pain, joint pain or stiffness.  LYMPHATICS: No enlarged nodes. No history of splenectomy.  PSYCHIATRIC: No history of depression or anxiety.  ENDOCRINOLOGIC: No reports of sweating, cold or heat intolerance. No polyuria or polydipsia.  Marland Kitchen   Physical Examination Vitals:   03/11/19 1249  BP: 109/67  Pulse: (!) 54  Temp: 98.9 F (37.2 C)  SpO2: 97%   Filed Weights   03/11/19 1249  Weight: 214 lb (97.1 kg)    Gen: resting comfortably, no acute distress HEENT: no scleral icterus, pupils equal round and reactive, no palptable cervical adenopathy,  CV: RRR, no m/r/g, no jvd Resp: Clear to auscultation bilaterally GI: abdomen is soft, non-tender, non-distended, normal bowel sounds, no hepatosplenomegaly MSK: extremities are warm, no edema.  Skin: warm, no rash Neuro:  no focal deficits Psych: appropriate affect   Diagnostic Studies 07/2018 cath  Prox LAD lesion is 95% stenosed.  Ramus lesion is 75% stenosed.  Lat Ramus lesion is 50% stenosed.  Mid LM lesion is 10% stenosed.  A drug-eluting stent was successfully placed using a STENT SYNERGY DES 2.75X20.  Post intervention, there is a 0% residual stenosis.  A drug-eluting stent was successfully placed using a STENT SYNERGY DES 2.5X20.  Post intervention, there is a 0% residual stenosis.  The left ventricular systolic function is normal. Mild mid anterior hypokinesis.  LV end diastolic pressure is normal. LVEDP 10 mm Hg.  The left ventricular ejection fraction is 50-55% by visual estimate.   Continue with aggressive secondary prevention.  He will need DAPT for 12 months.  Consider clopidogrel monotherapy after 12 months.   Intolerant of atorvastatin.  Will start Crestor 20 mg daily.  Lipid lowering therapy will be important going forward.    07/2018 echo IMPRESSIONS    1. The left ventricle has  normal systolic function with an ejection fraction of 60-65%. The cavity size was normal. There is mildly increased left ventricular wall thickness. Left ventricular diastolic Doppler parameters are consistent with impaired  relaxation.  2. The right ventricle has normal systolic function. The cavity was normal. There is no increase in right ventricular wall thickness.  3. The aortic root and ascending aorta are normal in size and structure.  4. The interatrial septum was not assessed.  Assessment and Plan  1. CAD with chronic stable angina - chronic symptoms with only high levels of activity that are stable - antianginal therapy has been limited by dizziness and low HRs - did not tolerate imdur - with low heart rates cannot increase coreg. On highest dose of norvasc already - will consider ranexa, but first would hold his coreg to see  if dizziness improves before starting ranexa   2. HTN - low normal bp's, he has lost significant amount of weight over the past month with healthy living - hold coreg as reported above, may be able to wean some other bp meds going forward.    Virtual visit 3 weeks  Antoine Poche, M.D

## 2019-04-04 ENCOUNTER — Encounter: Payer: Self-pay | Admitting: Cardiology

## 2019-04-04 ENCOUNTER — Telehealth (INDEPENDENT_AMBULATORY_CARE_PROVIDER_SITE_OTHER): Payer: No Typology Code available for payment source | Admitting: Cardiology

## 2019-04-04 VITALS — BP 127/77 | HR 54 | Ht 72.0 in | Wt 216.0 lb

## 2019-04-04 DIAGNOSIS — I25118 Atherosclerotic heart disease of native coronary artery with other forms of angina pectoris: Secondary | ICD-10-CM

## 2019-04-04 MED ORDER — RANOLAZINE ER 500 MG PO TB12: 500.0000 mg | ORAL_TABLET | Freq: Two times a day (BID) | ORAL | 3 refills | Status: DC

## 2019-04-04 NOTE — Addendum Note (Signed)
Addended byAbelino Derrick R on: 04/04/2019 02:12 PM   Modules accepted: Orders

## 2019-04-04 NOTE — Patient Instructions (Signed)
Medication Instructions:  STOP COREG   START RANEXA 500 MG- TWO TIMES DAILY   Labwork: NONE  Testing/Procedures: NONE  Follow-Up: Your physician recommends that you schedule a follow-up appointment in: 3 MONTHS    Any Other Special Instructions Will Be Listed Below (If Applicable).     If you need a refill on your cardiac medications before your next appointment, please call your pharmacy.

## 2019-04-04 NOTE — Progress Notes (Signed)
Virtual Visit via Telephone Note   This visit type was conducted due to national recommendations for restrictions regarding the COVID-19 Pandemic (e.g. social distancing) in an effort to limit this patient's exposure and mitigate transmission in our community.  Due to his co-morbid illnesses, this patient is at least at moderate risk for complications without adequate follow up.  This format is felt to be most appropriate for this patient at this time.  The patient did not have access to video technology/had technical difficulties with video requiring transitioning to audio format only (telephone).  All issues noted in this document were discussed and addressed.  No physical exam could be performed with this format.  Please refer to the patient's chart for his  consent to telehealth for [facility].   Date:  04/04/2019   ID:  Jordan Gross, DOB [birth year], MRN [id number]  Patient Location: Home Provider Location: Office  PCP:  Ignatius Specking, MD  Cardiologist:  Dina Rich, MD  Electrophysiologist:  None   Evaluation Performed:  Follow-Up Visit  Chief Complaint:  Follow up  History of Present Illness:    Jordan Gross is a 54 y.o. male seen today for follow up of the following medical problems.    1. CAD with chronic stable angina - 07/2018 cath: received DES to prox LAD and ramus - 07/2018 echo LVEF 60-65%   - some chest pains at times. 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 Dull pain midchest, 6/10 in severity. Can have some SOB or dizziness. Resolves after about 5 minutes.  - not positional - stable over the last several months as far as severity, frequency,  - dizziness with episodes.   - dizziness on imdur. Stopped taking.   - last visit we held coreg due to low hear rates and dizziness - stopped coreg, dizziness has improved.     2. Hyperlipidemia - 10/2018 TC 78 TG 102 HDL 26 LDL 32 - he reports  pcp lowered crestor to 10mg  daily due to low LDL - compliant with meds  The patient does not have symptoms concerning for COVID-19 infection (fever, chills, cough, or new shortness of breath).    Past Medical History:  Diagnosis Date  . CAD (coronary artery disease)    a. 07/2018: DES to proximal LAD and DES to RI.   08/2018 HLD (hyperlipidemia) 08/03/2018  . Hypertension   . Unstable angina (HCC) 08/02/2018   Past Surgical History:  Procedure Laterality Date  . CORONARY/GRAFT ACUTE MI REVASCULARIZATION N/A 08/02/2018   Procedure: CORONARY/GRAFT ACUTE MI REVASCULARIZATION;  Surgeon: 10/02/2018, MD;  Location: [facility] INVASIVE CV LAB;  Service: Cardiovascular;  Laterality: N/A;  . LEFT HEART CATH AND CORONARY ANGIOGRAPHY N/A 08/02/2018   Procedure: LEFT HEART CATH AND CORONARY ANGIOGRAPHY;  Surgeon: 10/02/2018, MD;  Location: [facility] INVASIVE CV LAB;  Service: Cardiovascular;  Laterality: N/A;     No outpatient medications have been marked as taking for the 04/04/19 encounter (Appointment) with 06/02/19, MD.     Allergies:   Other   Social History   Tobacco Use  . Smoking status: Former [employer]ine Poche  . Smokeless tobacco: Never Used  Substance Use Topics  . Alcohol use: Not Currently    Alcohol/week: 70.0 standard drinks    Types: 70 Cans of beer per week    Comment: Used to drink 12-15 beer a day; None since June 2020  . Drug use:  Never     Family Hx: The patient's family history includes CAD in his father and sister; Heart attack (age of onset: 38) in his mother. There is no history of Colon cancer or Colon polyps.  ROS:   Please see the history of present illness.     All other systems reviewed and are negative.   Prior CV studies:   The following studies were reviewed today:  07/2018 cath  Prox LAD lesion is 95% stenosed.  Ramus lesion is 75% stenosed.  Lat Ramus lesion is 50% stenosed.  Mid LM lesion is 10% stenosed.  A drug-eluting stent was  successfully placed using a STENT SYNERGY DES 2.75X20.  Post intervention, there is a 0% residual stenosis.  A drug-eluting stent was successfully placed using a STENT SYNERGY DES 2.5X20.  Post intervention, there is a 0% residual stenosis.  The left ventricular systolic function is normal. Mild mid anterior hypokinesis.  LV end diastolic pressure is normal. LVEDP 10 mm Hg.  The left ventricular ejection fraction is 50-55% by visual estimate.  Continue with aggressive secondary prevention. He will need DAPT for 12 months. Consider clopidogrel monotherapy after 12 months.   Intolerant of atorvastatin. Will start Crestor 20 mg daily. Lipid lowering therapy will be important going forward.    07/2018 echo IMPRESSIONS   1. The left ventricle has normal systolic function with an ejection fraction of 60-65%. The cavity size was normal. There is mildly increased left ventricular wall thickness. Left ventricular diastolic Doppler parameters are consistent with impaired  relaxation. 2. The right ventricle has normal systolic function. The cavity was normal. There is no increase in right ventricular wall thickness. 3. The aortic root and ascending aorta are normal in size and structure. 4. The interatrial septum was not assessed.   Labs/Other Tests and Data Reviewed:    EKG:  No ECG reviewed.  Recent Labs: 08/02/2018: TSH 2.664 08/03/2018: ALT 53; BUN 10; Creatinine, Ser 0.74; Hemoglobin 15.7; Platelets 291; Potassium 3.4; Sodium 137   Recent Lipid Panel Lab Results  Component Value Date/Time   CHOL 201 (H) 08/03/2018 02:11 AM   TRIG 152 (H) 08/03/2018 02:11 AM   HDL 33 (L) 08/03/2018 02:11 AM   CHOLHDL 6.1 08/03/2018 02:11 AM   LDLCALC 138 (H) 08/03/2018 02:11 AM    Wt Readings from Last 3 Encounters:  03/11/19 214 lb (97.1 kg)  12/26/18 216 lb (98 kg)  12/10/18 227 lb (103 kg)     Objective:    Vital Signs:   Today's Vitals   04/04/19 1238  BP: 127/77    Pulse: (!) 54  Weight: 216 lb (98 kg)  Height: 6' (1.829 m)   Body mass index is 29.29 kg/m. Normal affect. Normal speech pattern and tone. Comfortable, no apparent distress. No audible signs of SOB or wheezing.   ASSESSMENT & PLAN:    1. CAD with chronic stable angina - chronic symptoms with only high levels of activity that are stable - antianginal therapy has been limited by dizziness and low HRs - did not tolerate imdur due to dizziness. Off coreg due to low HRs and dizziness. Tolerating norvasc - start ranexa 500mg  bid and follow symptoms.     COVID-19 Education: The signs and symptoms of COVID-19 were discussed with the patient and how to seek care for testing (follow up with PCP or arrange E-visit).  The importance of social distancing was discussed today.  Time:   Today, I have spent 23 minutes with the patient  with telehealth technology discussing the above problems.     Medication Adjustments/Labs and Tests Ordered: Current medicines are reviewed at length with the patient today.  Concerns regarding medicines are outlined above.   Tests Ordered: No orders of the defined types were placed in this encounter.   Medication Changes: No orders of the defined types were placed in this encounter.   Follow Up:  In Person 3 months  Signed, Carlyle Dolly, MD  04/04/2019 12:15 PM    [place]

## 2019-07-03 ENCOUNTER — Encounter: Payer: Self-pay | Admitting: Cardiology

## 2019-07-03 ENCOUNTER — Other Ambulatory Visit: Payer: Self-pay

## 2019-07-03 ENCOUNTER — Ambulatory Visit (INDEPENDENT_AMBULATORY_CARE_PROVIDER_SITE_OTHER): Payer: PRIVATE HEALTH INSURANCE | Admitting: Cardiology

## 2019-07-03 VITALS — BP 112/64 | HR 58 | Temp 98.2°F | Ht 68.0 in | Wt 218.0 lb

## 2019-07-03 DIAGNOSIS — I25118 Atherosclerotic heart disease of native coronary artery with other forms of angina pectoris: Secondary | ICD-10-CM | POA: Diagnosis not present

## 2019-07-03 MED ORDER — RANOLAZINE ER 1000 MG PO TB12: 1000.0000 mg | ORAL_TABLET | Freq: Two times a day (BID) | ORAL | 11 refills | Status: DC

## 2019-07-03 NOTE — Progress Notes (Signed)
Clinical Summary Mr. Bushey is a 54 y.o.male seen today for follow up of the following medical problems.This is a focused visit on his history of CAD with chronic stable angina.    1. CAD with chronic stable angina - 07/2018 cath: received DES to prox LAD and ramus - 07/2018 echo LVEF 60-65%   - some chest pains at times. 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 Dull pain midchest, 6/10 in severity. Can have some SOB or dizziness. Resolves after about 5 minutes.  - not positional - stable over the last several months as far as severity, frequency,  - dizziness with episodes.   - dizziness on imdur and coreg, off both meds   - last visit we started ranexa 500mg  bid - no change in symptoms, he stopped taking after 1 month - symptoms overall stable since last visit.      Past Medical History:  Diagnosis Date  . CAD (coronary artery disease)    a. 07/2018: DES to proximal LAD and DES to RI.   08/2018 HLD (hyperlipidemia) 08/03/2018  . Hypertension   . Unstable angina (HCC) 08/02/2018     Allergies  Allergen Reactions  . Other     Some type of cholesterol medication, unknown what type that caused muscle cramps in his legs lipitor     Current Outpatient Medications  Medication Sig Dispense Refill  . acetaminophen (TYLENOL) 325 MG tablet Take 2 tablets (650 mg total) by mouth every 4 (four) hours as needed for headache or mild pain.    10/02/2018 amLODipine (NORVASC) 10 MG tablet Take 10 mg by mouth daily.    Marland Kitchen aspirin EC 81 MG tablet Take 81 mg by mouth daily.    . cetirizine (ZYRTEC) 10 MG tablet Take 10 mg by mouth daily as needed for allergies.    . clonazePAM (KLONOPIN) 1 MG tablet Take 1 mg by mouth at bedtime as needed for anxiety.    . hydrochlorothiazide (HYDRODIURIL) 25 MG tablet Take 12.5 mg by mouth daily.    Marland Kitchen losartan (COZAAR) 100 MG tablet Take 100 mg by mouth daily.    . nitroGLYCERIN (NITROSTAT) 0.4 MG  SL tablet Place 1 tablet (0.4 mg total) under the tongue every 5 (five) minutes x 3 doses as needed for chest pain. 25 tablet 4  . ranolazine (RANEXA) 500 MG 12 hr tablet Take 1 tablet (500 mg total) by mouth 2 (two) times daily. 180 tablet 3  . rosuvastatin (CRESTOR) 10 MG tablet Take 10 mg by mouth daily.    . ticagrelor (BRILINTA) 90 MG TABS tablet Take 1 tablet (90 mg total) by mouth 2 (two) times daily. 180 tablet 4   No current facility-administered medications for this visit.     Past Surgical History:  Procedure Laterality Date  . CORONARY/GRAFT ACUTE MI REVASCULARIZATION N/A 08/02/2018   Procedure: CORONARY/GRAFT ACUTE MI REVASCULARIZATION;  Surgeon: 10/02/2018, MD;  Location: [facility] INVASIVE CV LAB;  Service: Cardiovascular;  Laterality: N/A;  . LEFT HEART CATH AND CORONARY ANGIOGRAPHY N/A 08/02/2018   Procedure: LEFT HEART CATH AND CORONARY ANGIOGRAPHY;  Surgeon: 10/02/2018, MD;  Location: [facility] INVASIVE CV LAB;  Service: Cardiovascular;  Laterality: N/A;     Allergies  Allergen Reactions  . Other     Some type of cholesterol medication, unknown what type that caused muscle cramps in his legs lipitor      Family  History  Problem Relation Age of Onset  . Heart attack Mother 27  . CAD Father   . CAD Sister   . Colon cancer Neg Hx   . Colon polyps Neg Hx      Social History Mr. Dubuc reports that he has quit smoking. He has never used smokeless tobacco. Mr. Bail reports previous alcohol use of about 70.0 standard drinks of alcohol per week.   Review of Systems CONSTITUTIONAL: No weight loss, fever, chills, weakness or fatigue.  HEENT: Eyes: No visual loss, blurred vision, double vision or yellow sclerae.No hearing loss, sneezing, congestion, runny nose or sore throat.  SKIN: No rash or itching.  CARDIOVASCULAR: per hpi RESPIRATORY: No shortness of breath, cough or sputum.  GASTROINTESTINAL: No anorexia, nausea, vomiting or diarrhea. No  abdominal pain or blood.  GENITOURINARY: No burning on urination, no polyuria NEUROLOGICAL: No headache, dizziness, syncope, paralysis, ataxia, numbness or tingling in the extremities. No change in bowel or bladder control.  MUSCULOSKELETAL: No muscle, back pain, joint pain or stiffness.  LYMPHATICS: No enlarged nodes. No history of splenectomy.  PSYCHIATRIC: No history of depression or anxiety.  ENDOCRINOLOGIC: No reports of sweating, cold or heat intolerance. No polyuria or polydipsia.  Marland Kitchen   Physical Examination Today's Vitals   07/03/19 1334  BP: 112/64  Pulse: (!) 58  Temp: 98.2 F (36.8 C)  SpO2: 98%  Weight: 218 lb (98.9 kg)  Height: 5\' 8"  (1.727 m)   Body mass index is 33.15 kg/m.  Gen: resting comfortably, no acute distress HEENT: no scleral icterus, pupils equal round and reactive, no palptable cervical adenopathy,  CV: RRR, no m/r/g, no jvd Resp: Clear to auscultation bilaterally GI: abdomen is soft, non-tender, non-distended, normal bowel sounds, no hepatosplenomegaly MSK: extremities are warm, no edema.  Skin: warm, no rash Neuro:  no focal deficits Psych: appropriate affect   Diagnostic Studies  07/2018 cath  Prox LAD lesion is 95% stenosed.  Ramus lesion is 75% stenosed.  Lat Ramus lesion is 50% stenosed.  Mid LM lesion is 10% stenosed.  A drug-eluting stent was successfully placed using a STENT SYNERGY DES 2.75X20.  Post intervention, there is a 0% residual stenosis.  A drug-eluting stent was successfully placed using a STENT SYNERGY DES 2.5X20.  Post intervention, there is a 0% residual stenosis.  The left ventricular systolic function is normal. Mild mid anterior hypokinesis.  LV end diastolic pressure is normal. LVEDP 10 mm Hg.  The left ventricular ejection fraction is 50-55% by visual estimate.  Continue with aggressive secondary prevention. He will need DAPT for 12 months. Consider clopidogrel monotherapy after 12 months.    Intolerant of atorvastatin. Will start Crestor 20 mg daily. Lipid lowering therapy will be important going forward.   07/2018 echo IMPRESSIONS   1. The left ventricle has normal systolic function with an ejection fraction of 60-65%. The cavity size was normal. There is mildly increased left ventricular wall thickness. Left ventricular diastolic Doppler parameters are consistent with impaired  relaxation. 2. The right ventricle has normal systolic function. The cavity was normal. There is no increase in right ventricular wall thickness. 3. The aortic root and ascending aorta are normal in size and structure. 4. The interatrial septum was not assessed.    Assessment and Plan  1. CAD with chronic stable angina - symptoms are stable, occurring only with high levels of exertion - did not tolerate imdur or coreg. On norvasc. Will try ranexa at high dose 1000mg  bid - stop brillinta  08/02/19 completing a year - if symptosm were to progress would obtain an ischemic evaulation.    F/u 6 months  Antoine Poche, M.D.

## 2019-07-03 NOTE — Patient Instructions (Signed)
Medication Instructions: STOP Brilinta on 08/02/2019   INCREASE Ranexa to 1,000 mg twice a day   Labwork: None today. I will request from your primary doctor.   Procedures/Testing: None today  Follow-Up: 6 months Office visit with Dr.Branch   Any Additional Special Instructions Will Be Listed Below (If Applicable).     If you need a refill on your cardiac medications before your next appointment, please call your pharmacy.          Thank you for choosing  [facility] !

## 2019-08-26 NOTE — Progress Notes (Signed)
Referring Provider: Ignatius Specking, MD Primary Care Physician:  Ignatius Specking, MD Primary GI Physician: Dr. Jena Gauss  Chief Complaint  Patient presents with  . Colonoscopy    HPI:   Jordan Gross is a 54 y.o. male with a history of HTN, HLD, and CAD s/p DES to proximal LAD and DES to RI in June 2020.  Plans to continue DAPT x12 months.  He presents today to discuss scheduling colonoscopy.  I last saw patient in November 2020 to schedule first ever screening colonoscopy.  He is without any significant upper or lower GI symptoms.  We opted to hold off on scheduling colonoscopy until he completed 12 months of uninterrupted DAPT and followed up with cardiology as he had no GI symptoms or alarm symptoms.  Last office visit with cardiology 07/03/2019.  Noted chronic stable angina occurring only with high levels of exertion.  He did not tolerate indoor or Coreg.  He was on Norvasc with plans to try high-dose Ranexa.  He was to stop Brilinta on 08/02/2019.  Plan to follow-up in 6 months.  Today: No abdominal pain. BMs daily. No constipation or diarrhea. No blood in the stool or black stools. No unintentional weight loss. No GERD symptoms, N/V, or dysphagia.   Chest pain at times with exertion that resolves with rest. Rarely he will have pain at rest. Some shortness of breath, more so with exertion. Overall CP/SOB is stable since MI. No palpitations. No cough. Quit smoking about 13 years ago. Hasn't needed nitro.   Has dizziness with position changes, no pre-syncope or syncope.    Past Medical History:  Diagnosis Date  . CAD (coronary artery disease)    a. 07/2018: DES to proximal LAD and DES to RI.   Marland Kitchen HLD (hyperlipidemia) 08/03/2018  . Hypertension   . MI (myocardial infarction) (HCC) 07/2018  . Unstable angina (HCC) 08/02/2018    Past Surgical History:  Procedure Laterality Date  . CORONARY/GRAFT ACUTE MI REVASCULARIZATION N/A 08/02/2018   Procedure: CORONARY/GRAFT ACUTE MI  REVASCULARIZATION;  Surgeon: Corky Crafts, MD;  Location: [facility] INVASIVE CV LAB;  Service: Cardiovascular;  Laterality: N/A;  . LEFT HEART CATH AND CORONARY ANGIOGRAPHY N/A 08/02/2018   Procedure: LEFT HEART CATH AND CORONARY ANGIOGRAPHY;  Surgeon: Corky Crafts, MD;  Location: [facility] INVASIVE CV LAB;  Service: Cardiovascular;  Laterality: N/A;    Current Outpatient Medications  Medication Sig Dispense Refill  . acetaminophen (TYLENOL) 325 MG tablet Take 2 tablets (650 mg total) by mouth every 4 (four) hours as needed for headache or mild pain.    Marland Kitchen amLODipine (NORVASC) 10 MG tablet Take 10 mg by mouth daily.    Marland Kitchen aspirin EC 81 MG tablet Take 81 mg by mouth daily.    . clonazePAM (KLONOPIN) 1 MG tablet Take 1 mg by mouth at bedtime as needed for anxiety.    . hydrochlorothiazide (HYDRODIURIL) 25 MG tablet Take 12.5 mg by mouth daily.    Marland Kitchen losartan (COZAAR) 100 MG tablet Take 100 mg by mouth daily.    . nitroGLYCERIN (NITROSTAT) 0.4 MG SL tablet Place 1 tablet (0.4 mg total) under the tongue every 5 (five) minutes x 3 doses as needed for chest pain. 25 tablet 4  . rosuvastatin (CRESTOR) 10 MG tablet Take 10 mg by mouth daily.     No current facility-administered medications for this visit.    Allergies as of 08/27/2019 - Review Complete 08/27/2019  Allergen Reaction Noted  . Other  07/23/2018    Family History  Problem Relation Age of Onset  . Heart attack Mother 75  . CAD Father   . Prostate cancer Father   . CAD Sister   . Colon cancer Neg Hx   . Colon polyps Neg Hx     Social History   Socioeconomic History  . Marital status: Married    Spouse name: Not on file  . Number of children: Not on file  . Years of education: Not on file  . Highest education level: Not on file  Occupational History    Employer: [employer]  Tobacco Use  . Smoking status: Former Games developer  . Smokeless tobacco: Never Used  Vaping Use  . Vaping Use: Never used  Substance and Sexual  Activity  . Alcohol use: Not Currently    Alcohol/week: 70.0 standard drinks    Types: 70 Cans of beer per week    Comment: Used to drink 12-15 beer a day; None since June 2020  . Drug use: Never  . Sexual activity: Not on file  Other Topics Concern  . Not on file  Social History Narrative  . Not on file   Social Determinants of Health   Financial Resource Strain:   . Difficulty of Paying Living Expenses:   Food Insecurity:   . Worried About Programme researcher, broadcasting/film/video in the Last Year:   . Barista in the Last Year:   Transportation Needs:   . Freight forwarder (Medical):   Marland Kitchen Lack of Transportation (Non-Medical):   Physical Activity:   . Days of Exercise per Week:   . Minutes of Exercise per Session:   Stress:   . Feeling of Stress :   Social Connections:   . Frequency of Communication with Friends and Family:   . Frequency of Social Gatherings with Friends and Family:   . Attends Religious Services:   . Active Member of Clubs or Organizations:   . Attends Banker Meetings:   Marland Kitchen Marital Status:     Review of Systems: Gen: Denies fever, chills, cold or flu likely symptoms.  CV: See HPI Resp: See HPI GI: See HPI Psych: Admits to anxiety.  Heme: See HPI  Physical Exam: BP 130/75   Pulse (!) 54 Comment: checked manually  Temp 97.8 F (36.6 C) (Oral)   Ht 5\' 9"  (1.753 m)   Wt 218 lb 12.8 oz (99.2 kg)   BMI 32.31 kg/m  General:   Alert and oriented. No distress noted. Pleasant and cooperative.  Head:  Normocephalic and atraumatic. Eyes:  Conjuctiva clear without scleral icterus. Heart:  S1, S2 present without murmurs appreciated. Lungs:  Clear to auscultation bilaterally. No wheezes, rales, or rhonchi. No distress.  Abdomen:  +BS, soft, non-tender and non-distended. No rebound or guarding. No HSM or masses noted. Msk:  Symmetrical without gross deformities. Normal posture. Extremities:  Without edema. Neurologic:  Alert and  oriented x4 Psych:   Normal mood and affect.

## 2019-08-27 ENCOUNTER — Other Ambulatory Visit: Payer: Self-pay

## 2019-08-27 ENCOUNTER — Telehealth: Payer: Self-pay

## 2019-08-27 ENCOUNTER — Encounter: Payer: Self-pay | Admitting: Gastroenterology

## 2019-08-27 ENCOUNTER — Ambulatory Visit (INDEPENDENT_AMBULATORY_CARE_PROVIDER_SITE_OTHER): Payer: No Typology Code available for payment source | Admitting: Gastroenterology

## 2019-08-27 VITALS — BP 130/75 | HR 54 | Temp 97.8°F | Ht 69.0 in | Wt 218.8 lb

## 2019-08-27 DIAGNOSIS — Z1211 Encounter for screening for malignant neoplasm of colon: Secondary | ICD-10-CM | POA: Diagnosis not present

## 2019-08-27 NOTE — Patient Instructions (Signed)
We will get you scheduled for colonoscopy in the near future with Dr. Jena Gauss.  We are going to reach out to your cardiologist to get the okay to proceed.  As you are not having any other GI problems, we will see back as Dr. Jena Gauss recommends after colonoscopy.  Do not hesitate to call with questions or concerns prior.  Ermalinda Memos, PA-C [facility]

## 2019-08-27 NOTE — Assessment & Plan Note (Addendum)
54 year old male presenting to schedule first ever screening colonoscopy.  Past medical history significant for anxiety, HTN, HLD, CAD, MI s/p DES to proximal LAD and DES to RI in June 2020.  He has completed 12 months of DAPT and discontinued Brilinta in June 2021.  Continues to have mild intermittent chest pain with exertion, rarely at rest which is stable, cardiology following. He is without any significant upper or lower GI symptoms. No alarm symptoms. No family history of colon cancer.   Plan to proceed with TCS with propofol with Dr. Jena Gauss in the near future. The risks, benefits, and alternatives have been discussed in detail with patient. They have stated understanding and desire to proceed.  ASA III Will obtain clearance from cardiology prior to scheduling TCS.  Follow-up as recommended at the time of TCS.

## 2019-08-27 NOTE — Telephone Encounter (Signed)
Dr. Wyline Mood, pt is due for a TCS w propofol with Dr. Jena Gauss. Please advise if it's ok to proceed with procedure? Please advise.

## 2019-08-28 NOTE — Telephone Encounter (Signed)
Noted. Will call pt to schedule TCS.

## 2019-08-28 NOTE — Telephone Encounter (Signed)
Noted. Patient should already have instructions on encounter form for scheduling TCS.

## 2019-08-28 NOTE — Telephone Encounter (Signed)
Ok to proceed  Dominga Ferry MD

## 2019-08-28 NOTE — Telephone Encounter (Signed)
Routing to [facility] and [facility] clinical.

## 2019-09-02 ENCOUNTER — Telehealth: Payer: Self-pay | Admitting: *Deleted

## 2019-09-02 NOTE — Telephone Encounter (Signed)
Letter mailed

## 2019-09-02 NOTE — Telephone Encounter (Signed)
LMOVM to schedule TCS with propofol with Dr. Carver 

## 2019-11-07 IMAGING — CR PORTABLE CHEST - 1 VIEW
1 series · 2 of 2 positions shown · non-contrast
Comparison: 07/23/2018

CLINICAL DATA: Chest pain during stress test.

EXAM:
PORTABLE CHEST 1 VIEW

[Series 1: portable · 0.17mm/px · 2 of 2 slices shown]
[im 1/2]
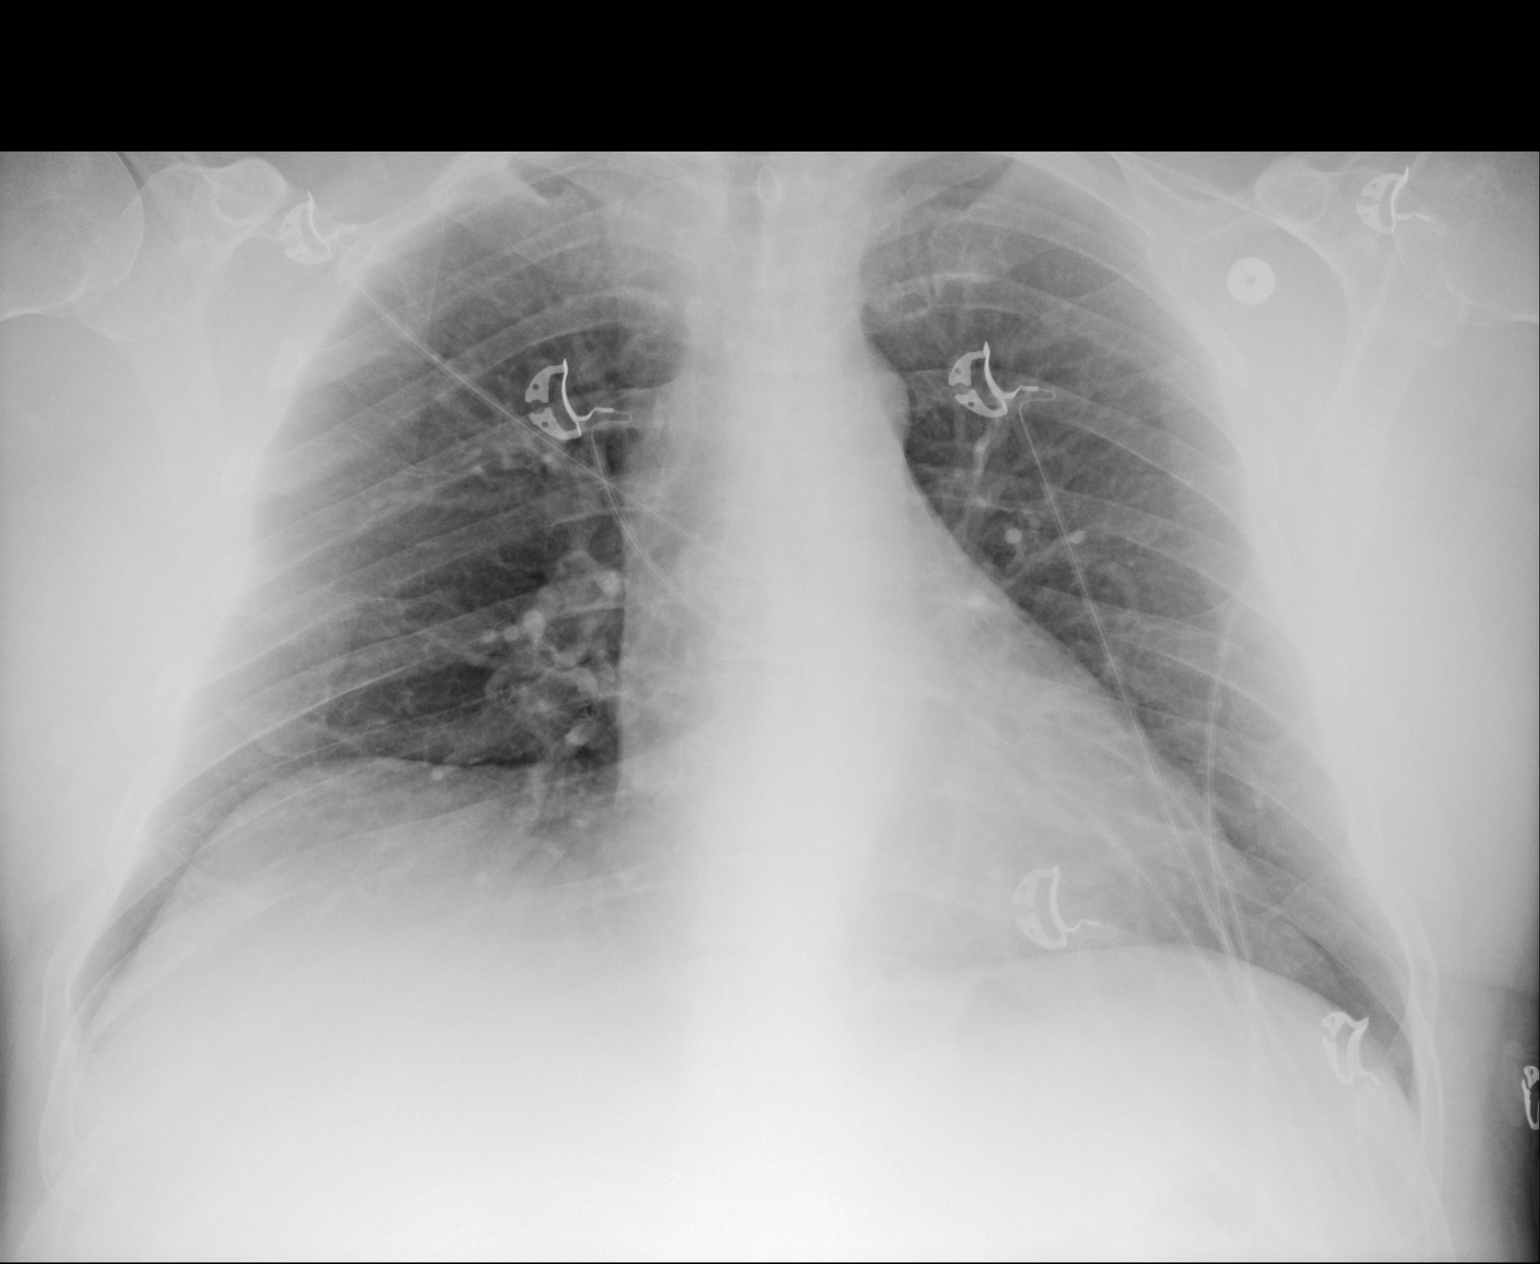
[im 2/2]
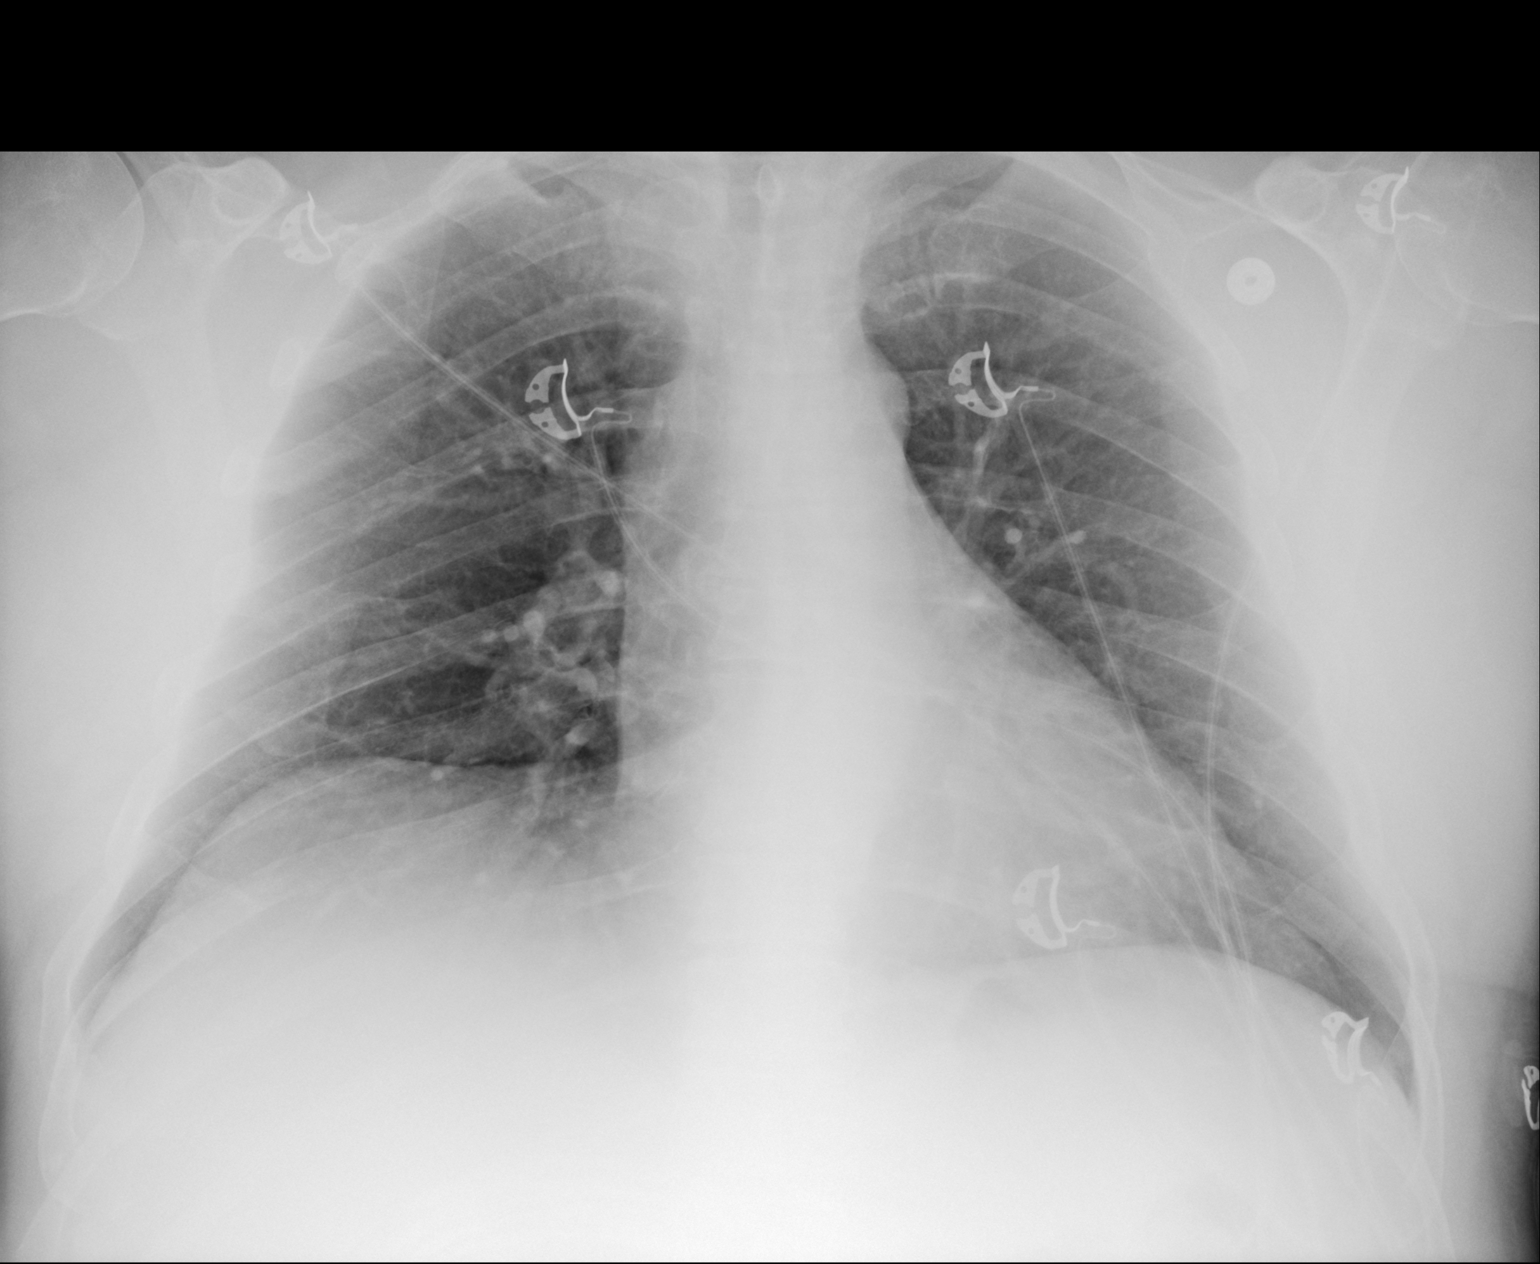

[2 of 2 positions shown; findings below may reference images not displayed]

FINDINGS: The heart size and mediastinal contours are within normal limits.
Both lungs are clear. The visualized skeletal structures are
unremarkable.
IMPRESSION: No active disease.

## 2019-12-08 ENCOUNTER — Encounter: Payer: Self-pay | Admitting: Cardiology

## 2019-12-08 ENCOUNTER — Ambulatory Visit (INDEPENDENT_AMBULATORY_CARE_PROVIDER_SITE_OTHER): Payer: PRIVATE HEALTH INSURANCE | Admitting: Cardiology

## 2019-12-08 ENCOUNTER — Other Ambulatory Visit: Payer: Self-pay

## 2019-12-08 VITALS — BP 140/82 | HR 52 | Ht 70.0 in | Wt 221.4 lb

## 2019-12-08 DIAGNOSIS — I10 Essential (primary) hypertension: Secondary | ICD-10-CM

## 2019-12-08 DIAGNOSIS — I25118 Atherosclerotic heart disease of native coronary artery with other forms of angina pectoris: Secondary | ICD-10-CM

## 2019-12-08 NOTE — Patient Instructions (Signed)
Medication Instructions:  Your physician recommends that you continue on your current medications as directed. Please refer to the Current Medication list given to you today.  *If you need a refill on your cardiac medications before your next appointment, please call your pharmacy*   Lab Work: NONE   If you have labs (blood work) drawn today and your tests are completely normal, you will receive your results only by: . MyChart Message (if you have MyChart) OR . A paper copy in the mail If you have any lab test that is abnormal or we need to change your treatment, we will call you to review the results.   Testing/Procedures: NONE    Follow-Up: At [facility], you and your health needs are our priority.  As part of our continuing mission to provide you with exceptional heart care, we have created designated Provider Care Teams.  These Care Teams include your primary Cardiologist (physician) and Advanced Practice Providers (APPs -  Physician Assistants and Nurse Practitioners) who all work together to provide you with the care you need, when you need it.  We recommend signing up for the patient portal called "MyChart".  Sign up information is provided on this After Visit Summary.  MyChart is used to connect with patients for Virtual Visits (Telemedicine).  Patients are able to view lab/test results, encounter notes, upcoming appointments, etc.  Non-urgent messages can be sent to your provider as well.   To learn more about what you can do with MyChart, go to [email or web address].    Your next appointment:   6 month(s)  The format for your next appointment:   In Person  Provider:   Jonathan Branch, MD   Other Instructions Thank you for choosing [facility]!    

## 2019-12-08 NOTE — Progress Notes (Signed)
Clinical Summary Mr. Avey is a 54 y.o.male seen today for follow up of the following medical problems.   1. CADwith chronic stable angina - 07/2018 cath: received DES to prox LAD and ramus - 07/2018 echo LVEF 60-65%   - some chest pains at times. 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 Dull pain midchest, 6/10 in severity. Can have some SOB or dizziness. Resolves after about 5 minutes.  - not positional - stable over the last several months as far as severity, frequency,  - dizziness with episodes.   - dizziness on imdur and coreg, off both meds   - last visit we increased ranexa to 1000mg  bid   - no recent chest pains - compliant with meds. He stopped ranexa, no chest pains since stopping   2. Hyperlipidemia - 10/2018 TC 78 TG 102 HDL 26 LDL 32 - he reports pcp lowered crestor to 10mg  daily due to low LDL - recent labs with pcp  3. HTN - 130/70 recent clinic visit both GI and his pcp   Has had covid vaccine, moderna.   Past Medical History:  Diagnosis Date  . CAD (coronary artery disease)    a. 07/2018: DES to proximal LAD and DES to RI.   HLD (hyperlipidemia) 08/03/2018  . Hypertension   . MI (myocardial infarction) (HCC) 07/2018  . Unstable angina (HCC) 08/02/2018     Allergies  Allergen Reactions  . Other     Some type of cholesterol medication, unknown what type that caused muscle cramps in his legs lipitor     Current Outpatient Medications  Medication Sig Dispense Refill  . acetaminophen (TYLENOL) 325 MG tablet Take 2 tablets (650 mg total) by mouth every 4 (four) hours as needed for headache or mild pain.    08/2018 amLODipine (NORVASC) 10 MG tablet Take 10 mg by mouth daily.    10/02/2018 aspirin EC 81 MG tablet Take 81 mg by mouth daily.    . clonazePAM (KLONOPIN) 1 MG tablet Take 1 mg by mouth at bedtime as needed for anxiety.    . hydrochlorothiazide (HYDRODIURIL) 25 MG tablet Take 12.5 mg  by mouth daily.    Marland Kitchen losartan (COZAAR) 100 MG tablet Take 100 mg by mouth daily.    . nitroGLYCERIN (NITROSTAT) 0.4 MG SL tablet Place 1 tablet (0.4 mg total) under the tongue every 5 (five) minutes x 3 doses as needed for chest pain. 25 tablet 4  . rosuvastatin (CRESTOR) 10 MG tablet Take 10 mg by mouth daily.     No current facility-administered medications for this visit.     Past Surgical History:  Procedure Laterality Date  . CORONARY/GRAFT ACUTE MI REVASCULARIZATION N/A 08/02/2018   Procedure: CORONARY/GRAFT ACUTE MI REVASCULARIZATION;  Surgeon: Marland Kitchen, MD;  Location: [facility] INVASIVE CV LAB;  Service: Cardiovascular;  Laterality: N/A;  . LEFT HEART CATH AND CORONARY ANGIOGRAPHY N/A 08/02/2018   Procedure: LEFT HEART CATH AND CORONARY ANGIOGRAPHY;  Surgeon: [facility], MD;  Location: [facility] INVASIVE CV LAB;  Service: Cardiovascular;  Laterality: N/A;     Allergies  Allergen Reactions  . Other     Some type of cholesterol medication, unknown what type that caused muscle cramps in his legs lipitor      Family History  Problem Relation Age of Onset  . Heart attack Mother 78  . CAD Father   . Prostate cancer Father   .  CAD Sister   . Colon cancer Neg Hx   . Colon polyps Neg Hx      Social History Mr. Santoli reports that he has quit smoking. He has never used smokeless tobacco. Mr. Magallon reports previous alcohol use of about 70.0 standard drinks of alcohol per week.   Review of Systems CONSTITUTIONAL: No weight loss, fever, chills, weakness or fatigue.  HEENT: Eyes: No visual loss, blurred vision, double vision or yellow sclerae.No hearing loss, sneezing, congestion, runny nose or sore throat.  SKIN: No rash or itching.  CARDIOVASCULAR: per hpi RESPIRATORY: No shortness of breath, cough or sputum.  GASTROINTESTINAL: No anorexia, nausea, vomiting or diarrhea. No abdominal pain or blood.  GENITOURINARY: No burning on urination, no  polyuria NEUROLOGICAL: No headache, dizziness, syncope, paralysis, ataxia, numbness or tingling in the extremities. No change in bowel or bladder control.  MUSCULOSKELETAL: No muscle, back pain, joint pain or stiffness.  LYMPHATICS: No enlarged nodes. No history of splenectomy.  PSYCHIATRIC: No history of depression or anxiety.  ENDOCRINOLOGIC: No reports of sweating, cold or heat intolerance. No polyuria or polydipsia.  Marland Kitchen   Physical Examination Today's Vitals   12/08/19 0820  BP: 140/82  Pulse: (!) 52  SpO2: 98%  Weight: 221 lb 6.4 oz (100.4 kg)  Height: 5\' 10"  (1.778 m)   Body mass index is 31.77 kg/m.  Gen: resting comfortably, no acute distress HEENT: no scleral icterus, pupils equal round and reactive, no palptable cervical adenopathy,  CV: RRR, no m/r/g, no jvd Resp: Clear to auscultation bilaterally GI: abdomen is soft, non-tender, non-distended, normal bowel sounds, no hepatosplenomegaly MSK: extremities are warm, no edema.  Skin: warm, no rash Neuro:  no focal deficits Psych: appropriate affect   Diagnostic Studies 07/2018 cath  Prox LAD lesion is 95% stenosed.  Ramus lesion is 75% stenosed.  Lat Ramus lesion is 50% stenosed.  Mid LM lesion is 10% stenosed.  A drug-eluting stent was successfully placed using a STENT SYNERGY DES 2.75X20.  Post intervention, there is a 0% residual stenosis.  A drug-eluting stent was successfully placed using a STENT SYNERGY DES 2.5X20.  Post intervention, there is a 0% residual stenosis.  The left ventricular systolic function is normal. Mild mid anterior hypokinesis.  LV end diastolic pressure is normal. LVEDP 10 mm Hg.  The left ventricular ejection fraction is 50-55% by visual estimate.  Continue with aggressive secondary prevention. He will need DAPT for 12 months. Consider clopidogrel monotherapy after 12 months.   Intolerant of atorvastatin. Will start Crestor 20 mg daily. Lipid lowering therapy will be  important going forward.   07/2018 echo IMPRESSIONS   1. The left ventricle has normal systolic function with an ejection fraction of 60-65%. The cavity size was normal. There is mildly increased left ventricular wall thickness. Left ventricular diastolic Doppler parameters are consistent with impaired  relaxation. 2. The right ventricle has normal systolic function. The cavity was normal. There is no increase in right ventricular wall thickness. 3. The aortic root and ascending aorta are normal in size and structure. 4. The interatrial septum was not assessed.    Assessment and Plan  1. CAD with chronic stable angina - did not tolerate imdur or coreg. On norvasc.  - was on ranexa however he stopped taking on his own, no current chest pains so can stay off - continue current meds  2. Hypertension - elevated today but just took meds, at last 2 visits with other providers he was at goal -continue current  regimen  EKG sinus brady, no acute ischemic changes     Antoine Poche, M.D.

## 2020-05-21 ENCOUNTER — Ambulatory Visit: Payer: PRIVATE HEALTH INSURANCE | Admitting: Cardiology

## 2021-06-14 ENCOUNTER — Other Ambulatory Visit: Payer: Self-pay | Admitting: Cardiology
